# Patient Record
Sex: Female | Born: 1980
Health system: Southern US, Community
[De-identification: ages and names within clinical notes are randomized; demographics above are authoritative.]

## PROBLEM LIST (undated history)

## (undated) DIAGNOSIS — R0602 Shortness of breath: Secondary | ICD-10-CM

## (undated) DIAGNOSIS — G8929 Other chronic pain: Secondary | ICD-10-CM

## (undated) DIAGNOSIS — M51369 Other intervertebral disc degeneration, lumbar region without mention of lumbar back pain or lower extremity pain: Secondary | ICD-10-CM

## (undated) DIAGNOSIS — F329 Major depressive disorder, single episode, unspecified: Secondary | ICD-10-CM

## (undated) DIAGNOSIS — M5136 Other intervertebral disc degeneration, lumbar region: Secondary | ICD-10-CM

## (undated) DIAGNOSIS — R569 Unspecified convulsions: Secondary | ICD-10-CM

## (undated) DIAGNOSIS — M549 Dorsalgia, unspecified: Secondary | ICD-10-CM

## (undated) DIAGNOSIS — K219 Gastro-esophageal reflux disease without esophagitis: Secondary | ICD-10-CM

## (undated) DIAGNOSIS — F1921 Other psychoactive substance dependence, in remission: Secondary | ICD-10-CM

## (undated) DIAGNOSIS — M5126 Other intervertebral disc displacement, lumbar region: Secondary | ICD-10-CM

## (undated) DIAGNOSIS — F419 Anxiety disorder, unspecified: Secondary | ICD-10-CM

## (undated) DIAGNOSIS — R05 Cough: Secondary | ICD-10-CM

## (undated) DIAGNOSIS — R053 Chronic cough: Secondary | ICD-10-CM

## (undated) DIAGNOSIS — F32A Depression, unspecified: Secondary | ICD-10-CM

## (undated) HISTORY — PX: WISDOM TOOTH EXTRACTION: SHX21

## (undated) HISTORY — PX: TUBAL LIGATION: SHX77

## (undated) HISTORY — PX: CHOLECYSTECTOMY: SHX55

---
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
## 2002-03-01 ENCOUNTER — Inpatient Hospital Stay (HOSPITAL_COMMUNITY): Admission: EM | Admit: 2002-03-01 | Discharge: 2002-03-05 | Payer: Self-pay | Admitting: Psychiatry

## 2002-12-31 ENCOUNTER — Emergency Department (HOSPITAL_COMMUNITY): Admission: EM | Admit: 2002-12-31 | Discharge: 2002-12-31 | Payer: Self-pay | Admitting: Emergency Medicine

## 2003-10-18 ENCOUNTER — Emergency Department (HOSPITAL_COMMUNITY): Admission: EM | Admit: 2003-10-18 | Discharge: 2003-10-18 | Payer: Self-pay | Admitting: Emergency Medicine

## 2004-05-09 ENCOUNTER — Emergency Department (HOSPITAL_COMMUNITY): Admission: EM | Admit: 2004-05-09 | Discharge: 2004-05-09 | Payer: Self-pay | Admitting: *Deleted

## 2005-05-14 ENCOUNTER — Emergency Department (HOSPITAL_COMMUNITY): Admission: EM | Admit: 2005-05-14 | Discharge: 2005-05-15 | Payer: Self-pay | Admitting: Emergency Medicine

## 2005-08-09 ENCOUNTER — Observation Stay (HOSPITAL_COMMUNITY): Admission: AD | Admit: 2005-08-09 | Discharge: 2005-08-10 | Payer: Self-pay | Admitting: Obstetrics and Gynecology

## 2005-11-04 ENCOUNTER — Ambulatory Visit (HOSPITAL_COMMUNITY): Admission: AD | Admit: 2005-11-04 | Discharge: 2005-11-04 | Payer: Self-pay | Admitting: Obstetrics and Gynecology

## 2005-11-08 ENCOUNTER — Inpatient Hospital Stay (HOSPITAL_COMMUNITY): Admission: AD | Admit: 2005-11-08 | Discharge: 2005-11-12 | Payer: Self-pay | Admitting: Obstetrics and Gynecology

## 2005-11-09 ENCOUNTER — Encounter: Payer: Self-pay | Admitting: Obstetrics and Gynecology

## 2006-08-02 ENCOUNTER — Inpatient Hospital Stay (HOSPITAL_COMMUNITY): Admission: EM | Admit: 2006-08-02 | Discharge: 2006-08-08 | Payer: Self-pay | Admitting: Emergency Medicine

## 2006-08-02 ENCOUNTER — Ambulatory Visit: Payer: Self-pay | Admitting: Internal Medicine

## 2006-08-05 ENCOUNTER — Encounter (INDEPENDENT_AMBULATORY_CARE_PROVIDER_SITE_OTHER): Payer: Self-pay | Admitting: *Deleted

## 2007-05-28 ENCOUNTER — Emergency Department (HOSPITAL_COMMUNITY): Admission: EM | Admit: 2007-05-28 | Discharge: 2007-05-28 | Payer: Self-pay | Admitting: Emergency Medicine

## 2007-09-27 ENCOUNTER — Encounter: Payer: Self-pay | Admitting: Orthopedic Surgery

## 2007-09-27 ENCOUNTER — Emergency Department (HOSPITAL_COMMUNITY): Admission: EM | Admit: 2007-09-27 | Discharge: 2007-09-27 | Payer: Self-pay | Admitting: Emergency Medicine

## 2007-10-06 ENCOUNTER — Ambulatory Visit: Payer: Self-pay | Admitting: Orthopedic Surgery

## 2007-10-06 DIAGNOSIS — S62319A Displaced fracture of base of unspecified metacarpal bone, initial encounter for closed fracture: Secondary | ICD-10-CM | POA: Insufficient documentation

## 2007-11-20 ENCOUNTER — Ambulatory Visit: Payer: Self-pay | Admitting: Orthopedic Surgery

## 2009-06-05 ENCOUNTER — Emergency Department (HOSPITAL_COMMUNITY): Admission: EM | Admit: 2009-06-05 | Discharge: 2009-06-05 | Payer: Self-pay | Admitting: Emergency Medicine

## 2009-06-22 ENCOUNTER — Emergency Department (HOSPITAL_COMMUNITY): Admission: EM | Admit: 2009-06-22 | Discharge: 2009-06-22 | Payer: Self-pay | Admitting: Emergency Medicine

## 2009-06-30 ENCOUNTER — Emergency Department (HOSPITAL_COMMUNITY): Admission: EM | Admit: 2009-06-30 | Discharge: 2009-06-30 | Payer: Self-pay | Admitting: Internal Medicine

## 2009-09-25 ENCOUNTER — Emergency Department (HOSPITAL_COMMUNITY): Admission: EM | Admit: 2009-09-25 | Discharge: 2009-09-25 | Payer: Self-pay | Admitting: Emergency Medicine

## 2009-11-25 ENCOUNTER — Emergency Department (HOSPITAL_COMMUNITY): Admission: EM | Admit: 2009-11-25 | Discharge: 2009-11-25 | Payer: Self-pay | Admitting: Emergency Medicine

## 2010-02-19 ENCOUNTER — Emergency Department (HOSPITAL_COMMUNITY): Admission: EM | Admit: 2010-02-19 | Discharge: 2010-02-19 | Payer: Self-pay | Admitting: Emergency Medicine

## 2010-04-29 ENCOUNTER — Emergency Department (HOSPITAL_COMMUNITY): Admission: EM | Admit: 2010-04-29 | Discharge: 2010-04-29 | Payer: Self-pay | Admitting: Emergency Medicine

## 2010-06-20 ENCOUNTER — Other Ambulatory Visit: Admission: RE | Admit: 2010-06-20 | Discharge: 2010-06-20 | Payer: Self-pay | Admitting: Obstetrics & Gynecology

## 2010-08-04 ENCOUNTER — Inpatient Hospital Stay (HOSPITAL_COMMUNITY): Admission: RE | Admit: 2010-08-04 | Discharge: 2010-08-07 | Payer: Self-pay | Admitting: Obstetrics and Gynecology

## 2010-10-08 ENCOUNTER — Encounter: Payer: Self-pay | Admitting: General Surgery

## 2010-11-28 LAB — URINALYSIS, ROUTINE W REFLEX MICROSCOPIC
Bilirubin Urine: NEGATIVE
Glucose, UA: NEGATIVE mg/dL
Ketones, ur: NEGATIVE mg/dL
Protein, ur: NEGATIVE mg/dL
pH: 6 (ref 5.0–8.0)

## 2010-11-28 LAB — CBC
HCT: 22.8 % — ABNORMAL LOW (ref 36.0–46.0)
HCT: 31.8 % — ABNORMAL LOW (ref 36.0–46.0)
Hemoglobin: 11 g/dL — ABNORMAL LOW (ref 12.0–15.0)
Hemoglobin: 8 g/dL — ABNORMAL LOW (ref 12.0–15.0)
MCH: 31.4 pg (ref 26.0–34.0)
MCV: 88.5 fL (ref 78.0–100.0)
MCV: 89.5 fL (ref 78.0–100.0)
RBC: 2.55 MIL/uL — ABNORMAL LOW (ref 3.87–5.11)
RBC: 3.6 MIL/uL — ABNORMAL LOW (ref 3.87–5.11)
WBC: 5.9 10*3/uL (ref 4.0–10.5)

## 2010-12-01 LAB — URINALYSIS, ROUTINE W REFLEX MICROSCOPIC
Bilirubin Urine: NEGATIVE
Glucose, UA: NEGATIVE mg/dL
Hgb urine dipstick: NEGATIVE
Ketones, ur: 15 mg/dL — AB
pH: 6.5 (ref 5.0–8.0)

## 2010-12-01 LAB — URINE CULTURE
Colony Count: >=100000
Culture  Setup Time: 201108142123

## 2010-12-04 LAB — COMPREHENSIVE METABOLIC PANEL
AST: 35 U/L (ref 0–37)
Albumin: 3.6 g/dL (ref 3.5–5.2)
Calcium: 9.1 mg/dL (ref 8.4–10.5)
Chloride: 103 mEq/L (ref 96–112)
Creatinine, Ser: 0.62 mg/dL (ref 0.4–1.2)
GFR calc Af Amer: >60 mL/min (ref >60)

## 2010-12-04 LAB — URINALYSIS, ROUTINE W REFLEX MICROSCOPIC
Glucose, UA: NEGATIVE mg/dL
Hgb urine dipstick: NEGATIVE
Protein, ur: NEGATIVE mg/dL

## 2010-12-04 LAB — URINE MICROSCOPIC-ADD ON

## 2010-12-04 LAB — DIFFERENTIAL
Eosinophils Relative: 2 % (ref 0–5)
Lymphocytes Relative: 30 % (ref 12–46)
Lymphs Abs: 2.1 10*3/uL (ref 0.7–4.0)
Monocytes Absolute: 0.4 10*3/uL (ref 0.1–1.0)

## 2010-12-04 LAB — ABO/RH: ABO/RH(D): A POS

## 2010-12-04 LAB — WET PREP, GENITAL: Yeast Wet Prep HPF POC: NONE SEEN

## 2010-12-04 LAB — CBC
MCV: 86.8 fL (ref 78.0–100.0)
Platelets: 195 10*3/uL (ref 150–400)
WBC: 7.1 10*3/uL (ref 4.0–10.5)

## 2010-12-04 LAB — GC/CHLAMYDIA PROBE AMP, GENITAL: Chlamydia, DNA Probe: NEGATIVE

## 2010-12-04 LAB — POCT PREGNANCY, URINE: Preg Test, Ur: POSITIVE

## 2010-12-04 LAB — URINE CULTURE: Colony Count: 30000

## 2010-12-10 ENCOUNTER — Emergency Department (HOSPITAL_COMMUNITY)
Admission: EM | Admit: 2010-12-10 | Discharge: 2010-12-10 | Disposition: A | Discharge status: Home / Self Care | Payer: Medicaid Other | Attending: Emergency Medicine | Admitting: Emergency Medicine

## 2010-12-10 ENCOUNTER — Emergency Department (HOSPITAL_COMMUNITY): Payer: Medicaid Other

## 2010-12-10 DIAGNOSIS — M549 Dorsalgia, unspecified: Secondary | ICD-10-CM | POA: Insufficient documentation

## 2010-12-10 DIAGNOSIS — Y92009 Unspecified place in unspecified non-institutional (private) residence as the place of occurrence of the external cause: Secondary | ICD-10-CM | POA: Insufficient documentation

## 2010-12-10 DIAGNOSIS — S40019A Contusion of unspecified shoulder, initial encounter: Secondary | ICD-10-CM | POA: Insufficient documentation

## 2010-12-10 DIAGNOSIS — W108XXA Fall (on) (from) other stairs and steps, initial encounter: Secondary | ICD-10-CM | POA: Insufficient documentation

## 2010-12-11 ENCOUNTER — Emergency Department (HOSPITAL_COMMUNITY)
Admission: EM | Admit: 2010-12-11 | Discharge: 2010-12-12 | Disposition: A | Discharge status: Home / Self Care | Payer: Medicaid Other | Attending: Emergency Medicine | Admitting: Emergency Medicine

## 2010-12-11 DIAGNOSIS — T07XXXA Unspecified multiple injuries, initial encounter: Secondary | ICD-10-CM | POA: Insufficient documentation

## 2010-12-22 LAB — URINALYSIS, ROUTINE W REFLEX MICROSCOPIC
Bilirubin Urine: NEGATIVE
Glucose, UA: NEGATIVE mg/dL
Hgb urine dipstick: NEGATIVE
Specific Gravity, Urine: >1.03 — ABNORMAL HIGH (ref 1.005–1.030)
pH: 6 (ref 5.0–8.0)

## 2011-01-13 ENCOUNTER — Emergency Department (HOSPITAL_COMMUNITY)
Admission: EM | Admit: 2011-01-13 | Discharge: 2011-01-13 | Disposition: A | Discharge status: Home / Self Care | Payer: Medicaid Other | Attending: Emergency Medicine | Admitting: Emergency Medicine

## 2011-01-13 DIAGNOSIS — S335XXA Sprain of ligaments of lumbar spine, initial encounter: Secondary | ICD-10-CM | POA: Insufficient documentation

## 2011-01-13 DIAGNOSIS — Y92009 Unspecified place in unspecified non-institutional (private) residence as the place of occurrence of the external cause: Secondary | ICD-10-CM | POA: Insufficient documentation

## 2011-01-13 DIAGNOSIS — G43909 Migraine, unspecified, not intractable, without status migrainosus: Secondary | ICD-10-CM | POA: Insufficient documentation

## 2011-01-13 DIAGNOSIS — Y998 Other external cause status: Secondary | ICD-10-CM | POA: Insufficient documentation

## 2011-01-13 DIAGNOSIS — X500XXA Overexertion from strenuous movement or load, initial encounter: Secondary | ICD-10-CM | POA: Insufficient documentation

## 2011-01-13 DIAGNOSIS — F411 Generalized anxiety disorder: Secondary | ICD-10-CM | POA: Insufficient documentation

## 2011-02-02 NOTE — Consult Note (Signed)
Victoria Terrell, Victoria Terrell             ACCOUNT NO.:  [id number]   MEDICAL RECORD NO.:  [id number]          PATIENT TYPE:  INP   LOCATION:  A332                          FACILITY:  [facility]   PHYSICIAN:  Lionel December, M.D.    DATE OF BIRTH:  07/06/1981   DATE OF CONSULTATION:  08/02/2006  DATE OF DISCHARGE:                                   CONSULTATION   REASON FOR CONSULTATION:  Dilated biliary system in a patient who also has  acute cholecystitis.   HISTORY OF PRESENT ILLNESS:  Victoria Terrell is a 30 year old Caucasian female who  has had intermittent upper abdominal pain over the last several years and  has been treated for PUD and/or GERD with a PPI.  She was in her usual state  of health until two days ago when she developed pain in her right upper  quadrant and pain has been progressive and constant and intense.  She also  experienced nausea and vomiting yesterday.  On one occasion, she vomited  small amount of fresh blood.  She has not experienced any melena.  She also  has been having chills and felt hot at times and other times cold, although  she did not check her temperature.  During the last two days, she has  developed a distaste for cigarette smoking.  As she was not feeling any  better, she decided to come to the emergency room this morning.  She was  noted to have an elevated white cell count this morning. She was noted to  have leukocytosis.  She was very tender in the right upper quadrant.  She  had normal LFTs.  She had plain films followed by abdominal pelvic CT and  ultrasound.  She had a thickened gallbladder wall with pericholecystic fluid  collection as well as cholelithiasis, a large CBD measuring 15-16 cm, and  prominent intrahepatic biliary radicals.  CT also showed a small amount of  ascites.  She had an ultrasound which essentially showed similar findings as  far as hepatobiliary tree is concerned and there is a suggestion of a stone  in her bile duct.  The patient was  admitted to Dr. Daisy Blossom service.  She  has been begun on cephalosporin and he plans to remove her gallbladder  within the next couple of days in the near future.  Because of a dilated  bile duct and the question of stones, he felt the patient may need ERCP at  some point, hence, the reason for consultation.   The patient states she was diagnosed with peptic ulcer disease when she was  30 years old.  She has been on Prilosec for a number of years but now she is  on Nexium 40 mg b.i.d., but she does not take it every day.  Sometimes she  feels it is helping but other times is not.  The pain has been epigastric  and right upper quadrant.  She was having problems during her pregnancy,  however, she did not have ultrasound of for gallbladder.   At home, she is on Nexium 40 mg b.i.d. and Xanax 1 mg q.h.s.  PAST MEDICAL HISTORY:  History of PUD as above.  She had a C-section eight  months ago.   ALLERGIES:  No known drug allergies.   CURRENT MEDICATIONS:  She is presently on Rocephin 1 gram IV q.24h., Ativan  1 mg p.o. q.h.s., morphine sulfate 2-3 mg q.4h. IV p.r.n., Zofran 4 mg IV  q.8h. p.r.n.   FAMILY HISTORY:  Father had his gallbladder removed when he was 85 years  old.  Mother and one sister are in good health.   SOCIAL HISTORY:  She is single.  She is engaged to be married.  She has  worked at [employer] and on but is presently staying home caring for  her daughter.  She has been smoking cigarettes since she was 15, presently  smoking half a pack a day.  She does not drink alcohol.   PHYSICAL EXAMINATION:  GENERAL:  Well-developed, mildly obese Caucasian female who appears to be in  some pain and prefers to lie in a propped up position.  VITAL SIGNS:  Her estimated weight is 170 pounds (per patient, she has not  been weighed yet), she is 5 feet 6 inches tall, pulse 72 per minute, blood  pressure 108/60, temperature is 97.9, respirations 20.  HEENT:  Conjunctivae is  pink.  Sclerae is nonicteric.  Oral pharyngeal  mucosa is normal.  NECK:  No neck masses are noted.  CARDIAC:  Regular rate and rhythm.  Normal S1 and S2.  No murmur or gallop  noted.  LUNGS:  Clear to auscultation.  She does have two tattoos over her upper  chest posteriorly and one at the base of her neck.  ABDOMEN:  Symmetrical, bowel sounds are normal, soft with moderate  tenderness with guarding below the right costal margin.  Liver edge is not  felt.  EXTREMITIES:  She does not have clubbing or peripheral edema.   LABS ON ADMISSION:  WBC is 14.2, H&H is 12.7 and 38.2, platelet count is  281,000, she has 81 segs.  Her bilirubin is 4.7, AP 101, AST 15, ALT 12,  total protein 7.4, with albumin of 3.8, calcium is 9.  Sodium 135, potassium  3.5, chloride 102, CO2 26, glucose 117, BUN 10, creatinine 0.7.   Her CT and ultrasound was reviewed with Dr. Chestine Spore with findings as above.   ASSESSMENT:  Victoria Terrell is a 30 year old Caucasian female who presents with a  two day history of right upper quadrant pain associated with nausea,  vomiting, chills and possibly fever, with a single episode of hematemesis  plus leukocytosis and normal LFTs and serum lipase (19).  She has a  thickened gallbladder wall on CT and ultrasound with pericholecystic fluid  on both of these studies suggestive of acute cholecystitis.  She has  multiple stones.  She also has a dilated bile duct with a question of stone  distally but her bilirubin and transaminase and alkaline phosphatase are  normal, implying that she does not have bile duct obstruction.  Given that  her duct is over 15 mm, it is possible that she has been passing stones  intermittently and may been having biliary colic and not PUD as has been  suspected in the past.  Since her bilirubin and transaminases are normal,  there is no urgency to do ERCP.  However, if her LFTs are abnormal in the a.m., I would consider ERCP and sphincterotomy.  I suspect her  present  symptomatology is secondary to acute cholecystitis and she is on  a  cephalosporin and surgery is planned in the future by Dr. Malvin Johns.  She has  a small amount of ascites and this may be related to her acute  cholecystitis.  There is nothing to suggest perforated gallbladder.  Fluid  in her pelvis may not even be related to her biliary tract disease.  Her  pregnancy test was done and is negative.   She had a single episode of hematemesis most likely secondary to [facility]-  Weiss tear.  Her upper GI tract could be evaluated prior to ERCP.   The patient needs to be tested for H. pylori serology given this history of  PUD, although it has never been confirmed that she had PUD.   She has multiple tattoos and, therefore, at risk for hep C.   RECOMMENDATIONS:  1. LFTs in a.m. as planned.  2. I have taken the liberty of discontinuing her Pepcid and will start her      on Protonix 40 mg IV q.24h.  3. We will check her H. pylori serology and hepatitis C virus antibody.  4. Will consider a therapeutic ERCP in a.m. if LFTs are elevated,      otherwise, may delay it until after cholecystectomy.  Hopefully, she      can have interoperative cholangiogram at the time of cholecystectomy      and if she, indeed, has stones then she could have an ERCP following      her cholecystectomy.   We would like to thank Dr. Malvin Johns for the opportunity to participate in  the care of this nice lady.      Lionel December, M.D.  Electronically Signed     NR/MEDQ  D:  08/02/2006  T:  08/02/2006  Job:  [id number]

## 2011-02-02 NOTE — Op Note (Signed)
[name], Victoria Terrell             ACCOUNT NO.:  [id number]   MEDICAL RECORD NO.:  [id number]          PATIENT TYPE:  INP   LOCATION:  LDR1                          FACILITY:  [facility]   PHYSICIAN:  [name], M.D. DATE OF BIRTH:  1981/08/24   DATE OF PROCEDURE:  11/09/2005  DATE OF DISCHARGE:                                 OPERATIVE REPORT   PREOPERATIVE DIAGNOSIS:  Pregnancy, 40+ weeks gestation, uncertain fetal  status.   POSTOPERATIVE DIAGNOSIS:  Pregnancy, 40+ weeks gestation, uncertain fetal  status. Uncertain fetal status secondary to nuchal cord x1.   PROCEDURE:  Primary low transverse cervical Cesarean section.   SURGEON:  [name], M.D.   ASSISTANTTomasa Rand, R.N., [place].   ANESTHESIA:  Epidural.   COMPLICATIONS:  None.   FINDINGS:  Light meconium discolored amniotic fluid and placenta, molded  vertex and persistent occiput transverse position. Tight nuchal cord x1.  Scanty Wharton's generally on the cord.   DETAILS OF PROCEDURE:  The patient was taken to the operating room, prepped  and draped for lower abdominal surgery with scalp electrode in place. Due to  the fetal well-being being confirmed, once we got her downstairs, we  proceeded with lidocaine epidural and allowed it to set up. Fetal well-being  was monitored continuous. Once analgesic effect was achieved, a transverse  uterine incision was performed, excising some skin that included two sites  where they were pigmented nevi that needed to be removed for appearance  concerns. This was taken out and the skin crease beneath it. Lower abdomen  was opened in the standard method of Pfannenstiel incision and peritoneal  cavity found to be normal. Bladder flap was developed on the lower uterine  segment with ease and transverse uterine incision performed carefully down  to the vertex, extending laterally using index finger traction and the fetal  vertex rotated into the incision and delivered  using fundal pressure. Vacuum  extractor was placed on the table but was not utilized. The infant was  delivered. Nuchal cord x1 encountered, slipped over the vertex, and then the  head delivered. We suctioned the mouth and nasopharynx out prior to delivery  of the fetal body. Nuchal cord x1 had been released. Shoulders were used for  axillary traction. The head was not utilized. The body was delivered, cord  clamped, and the baby passed to the waiting pediatrician, Dr. Francoise Schaumann.  Halm.   Uterus was irrigated with antibiotic solution after the placenta had been  delivered. Cord blood samples were obtained prior to placental extraction.   Single layer of running locking closure of the uterus was performed.  Hemostasis found to be adequate. Two-0 chromic closure of the bladder flap  performed. The abdomen was irrigated. Laparotomy tapes removed. The  peritoneum closed with 2-0 chromic and then the fascia closed with 0 Vicryl  and subcu fatty tissues reapproximated with 2-0 plain. Staple closure of the  skin completed the procedure with 800 cc EBL. Sponge and needle counts  correct.      [name], M.D.  Electronically Signed     JVF/MEDQ  D:  11/09/2005  T:  11/09/2005  Job:  [id number]

## 2011-02-02 NOTE — H&P (Signed)
[facility]  Patient:    Victoria Terrell, Victoria Terrell Visit Number: [id number] MRN: [id number]          Service Type: PSY Location: 300 0301 02 Attending Physician:  Rachael Fee Dictated by:   Candi Leash. Orsini, N.P. Admit Date:  03/01/2002                     Psychiatric Admission Assessment  IDENTIFYING INFORMATION:  An 30 year old single white female, voluntarily admitted for intentional overdose on March 01, 2002.  HISTORY OF PRESENT ILLNESS:  The patient presents with a history of intentional overdose, having a "bad time" at night, having difficulty sleeping, reports she has been thinking about her life, that she has no license.  She has dropped out of school.  She is having relationship problems with her boyfriend.  The patient overdosed at home, taking 7 Tylenol tablets, 20 Prilosec tablets, and 2-3 amoxicillin tablets, and several of her birth control pills.  The patient was at home.  She then told her mother and was taken to the emergency department.  The patient states she knows that it was a stupid thing to do.  Her sleeping has been decreased.  Her appetite has been satisfactory.  The patient does report some depressive symptoms, has been on an antidepressant in the past.  She reports problems with hopelessness, but denies any psychotic symptoms.  PAST PSYCHIATRIC HISTORY:  First hospitalization at [facility]. Was hospitalized years ago as an adolescent.  No outpatient treatment.  SOCIAL HISTORY:  She is an 30 year old single white female with no children. She lives with her mother.  The last grade she completed was the 9th grade. She has no drivers license.  She states her parents are supportive.  No legal problems.  FAMILY HISTORY:  Father with social anxiety, sister with bipolar.  ALCOHOL DRUG HISTORY:  Patient smokes, she denies any alcohol use, smokes marijuana.  PAST MEDICAL HISTORY:  Primary care Jisella Ashenfelter is Dr. Stephannie Peters.   Medical problems are history of ulcers.  MEDICATIONS:  Prilosec 20 mg q.d., has been on Paxil in the past.  DRUG ALLERGIES:  No known allergies.  PHYSICAL EXAMINATION:  Performed at emergency room at [facility].  Her urine pregnancy test was negative.  Urine drug screen was positive for THC.  Alcohol level was less than 5.  CBC within normal limits.  Potassium level was 3.4. Acetaminophen level was 3.6 and decreased to 3.  MENTAL STATUS EXAMINATION:  She is alert, young, cooperative female, dressed in hospital wear, fair eye contact.  Speech is clear.  Mood is depressed, affect is sad.  Thought processes are coherent, goal directed.  No auditory or visual hallucinations or suicidal or homicidal ideations.  Cognitive function intact.  Memory is good, judgment is poor, insight is fair.  ADMISSION DIAGNOSES: Axis I:    Depressive disorder not otherwise specified. Axis II:   Deferred. Axis III:  History of ulcers. Axis IV:   Problems with primary support group, education, occupation, and            other psychosocial problems. Axis V:    Current 35, estimated this past year 77.  INITIAL PLAN OF CARE:  Voluntary admission to [facility] for intentional overdose.  Contract for safety, check every 15 minutes.  Will offer clear liquids while patient is complaining of nausea and having episodes of vomiting.  The patient to attend groups, Ambien for sleep.  To increase her coping skills,  to follow up with mental health, have family session prior to discharge.  Will initiate an antidepressant when the patient is feeling better.  TENTATIVE LENGTH OF STAY:  3-4 days. Dictated by:   Candi Leash. Orsini, N.P. Attending Physician:  Rachael Fee DD:  03/01/02 TD:  03/02/02 Job: [id number]

## 2011-02-02 NOTE — H&P (Signed)
NAMEJAZALYN, MONDOR             ACCOUNT NO.:  [id number]   MEDICAL RECORD NO.:  [id number]          PATIENT TYPE:  OIB   LOCATION:  LDR2                          FACILITY:  [facility]   PHYSICIAN:  [name], M.D. DATE OF BIRTH:  1981/07/13   DATE OF ADMISSION:  08/09/2005  DATE OF DISCHARGE:  LH                                HISTORY & PHYSICAL   ADMITTING DIAGNOSIS:  1.  Pregnancy at [redacted] weeks gestation.  2.  Dehydration.  3.  Ketosis.  4.  Bronchitis.  5.  Possible urinary tract infection.   HISTORY OF PRESENT ILLNESS:  This 30 year old female, gravida 1, para 0,  last menstrual period Jan 29, 2005, placing [facility] at November 05, 2005.  By  the gestational criteria, she is now 27 plus weeks gestation.  She presents  tonight with 2 days of coughing, inability to keep anything down, vomiting  due to the coughing, with an additional history of 4 days of urinary  frequency.  Urinalysis shows a specific gravity of 1.030, 3+ ketonuria,  presence of nitrates and esterase.  She is admitted for hydration,  antitussives, initiation of antibiotic therapy to cover the possibility of a  UTI.   She complained earlier of decreased fetal movement, but the baby has been  quite active on the external monitor with fetal heart rate in the normal  range.  There has been no bleeding, gush of fluid, or uterine activity  reported by the patient, or noted during the initial assessment.   PHYSICAL EXAMINATION:  GENERAL:  A healthy rather ill-feeling Caucasian  female, alert and oriented x3.  Weight in the 220 range.  HEENT:  Injected conjunctivae and red nasal mucosa.  CHEST:  Tubular breath sounds with occasional wheezes.  ABDOMEN:  Nontender.  Fetal heart rate in the normal range.  PELVIC EXAM:  Deferred.   Urine specific gravity mentioned earlier at 1.030.   PLAN:  Antibiotics, overnight observation, hydration, short hospital stay.      [name], M.D.  Electronically Signed     JVF/MEDQ  D:  08/09/2005  T:  08/10/2005  Job:  [id number]   cc:   [facility]

## 2011-02-02 NOTE — Op Note (Signed)
NAMESHAENA, [name]             ACCOUNT NO.:  [id number]   MEDICAL RECORD NO.:  [id number]          PATIENT TYPE:  INP   LOCATION:  A412                          FACILITY:  [facility]   PHYSICIAN:  [name], M.D.   DATE OF BIRTH:  18-Nov-1980   DATE OF PROCEDURE:  11/09/2005  DATE OF DISCHARGE:                                 OPERATIVE REPORT   EPIDURAL NOTE  [name] is 5 cm and requesting an epidural to be placed.   DESCRIPTION OF PROCEDURE:  She is placed in sitting position.  Betadine prep  is used.  Field drape is placed.  Lidocaine 1% is injected into the L3-L4  interspace.  A 17-gauge Touhy needle is used and a loss of resistance  technique employed; and the epidural space is found with one pass  Then 10  mL of 0.125% bupivacaine is given, as a test dose, without ill effects.  The  epidural catheter is fed 5 cm into the epidural space; and an additional 10  mL is given through the epidural catheter which is taped down.  The  continuous infusion is begun at 12 mL/h of 0.125% bupivacaine with 2 mcg/mL  of fentanyl. The patient tolerated the procedure well.  She is getting good  pain relief.  Fetal heart rate tracing is stable.      [name], M.D.  Electronically Signed     LHE/MEDQ  D:  11/09/2005  T:  11/09/2005  Job:  [id number]

## 2011-02-02 NOTE — Consult Note (Signed)
Victoria Terrell, HARNISH NO.:  [id number]   MEDICAL RECORD NO.:  [id number]          PATIENT TYPE:  EMS   LOCATION:  ED                            FACILITY:  [facility]   PHYSICIAN:  Barbaraann Barthel, M.D. DATE OF BIRTH:  04/12/1981   DATE OF CONSULTATION:  08/02/2006  DATE OF DISCHARGE:                                 CONSULTATION   NOTE:  Surgery was asked to see this 30 year old white female for right  upper quadrant pain and for gallbladder disease after CT scan revealed  acute inflammation about the gallbladder.   HISTORY OF PRESENT MEDICAL ILLNESS:  The patient states that she has had  recurrent right upper quadrant pain. This is the worst episode she has  ever had. However, she has had recurrent right upper quadrant pain on a  long-term basis.  She thought that this was related to peptic ulcer  disease.  However, she has never had an upper GI endoscopy, and the  Prilosec which she has taken has not helped this disease, and food  worsens the disease and does not make it better.  This would lead me to  suspect that she has had recurrent cholecystitis at various times that  she has had these complaints.  She states that 2 days ago, about 2 or 3  hours after lunch, she had right upper quadrant pain, multiple episodes  of nausea and vomiting, and she remained this way for approximately 24  hours.  When the pain did not abate, she came to the emergency room, and  she had a CT scan of the abdomen done and other lab workup.  Her liver  function studies and amylase were grossly within normal limits.  Her  white count was mildly elevated with a left shift.  Her lipase was not  elevated.   PHYSICAL EXAMINATION:  GENERAL:  She is a 30 year old white female,  uncomfortable but in no acute distress.  VITAL SIGNS:  She is 5 feet 6 inches tall, weighs approximately 169  pounds, temperature is 97, her blood pressure running from 116-96  systolic/69-48.  Her pulse rate has been  from 94-69, respirations from  16-24 in the emergency room.  Her decrease in blood pressure was  concomitant with the administration of morphine for pain.   HEENT:  Head is normocephalic.  Eyes:  Extraocular movements are intact.  Pupils are round and react to light and accommodation.  There is no  conjunctival pallor or icteric tincture.  Nose and oral mucosa are  moist.  NECK:  Supple and cylindrical, without jugular vein distension,  thyromegaly, or tracheal deviation.  There is no cervical adenopathy,  and there are no bruits auscultated.  The patient has tattoos on the  back of her neck.  CHEST:  Clear both to anterior and posterior auscultation.  HEART:  Regular rhythm.  BREASTS/AXILLAE:  without masses.  ABDOMEN:  The patient is very tender in the right upper quadrant.  No  mass palpated.  However, she has clear guarding in the right upper  quadrant.  Bowel sounds are present.  No femoral or inguinal  hernias are  appreciated.  RECTAL:  Guaiac-negative stool.  EXTREMITIES:  Pulses are full and symmetrical, without clubbing,  varicosities, or cyanosis.  She has tattoos on her extremities.   REVIEW OF SYSTEMS:  GI:  Recurrent episodes of right upper quadrant pain  radiating to her back that had been postprandial in nature, accompanied  with nausea and vomiting.  No history of weight loss. No history of  hepatitis.  No history of bright red rectal bleeding, black tarry stools  or unexplained weight loss.  The patient has had these symptoms referred  to in the past for peptic ulcer disease.  However, she has not had an  endoscopy to confirm any of this.  GU:  No history of nephrolithiasis or dysuria.  OB/GYN:  She is a  gravida 1, para 0, cesarean 1, abortus 0  female who has an 16-month-old  child,  and a history of a having a maternal grandmother who has had  carcinoma of the breast.  Urine HCG is negative, and the patient is  currently having her menstrual cycle.   CARDIORESPIRATORY:  Grossly  within normal limits.  The patient smokes half a pack of cigarettes per  day and does not drink any alcohol.  MUSCULOSKELETAL:  Grossly within  normal limits.  NEUROLOGIC:  Within normal limits.  The patient has a  history of migraines occasionally.  ENDOCRINE:  No history of diabetes  or thyroid disease.   MEDICATIONS:  The patient takes Prilosec.   She has no known allergies.   PAST SURGICAL HISTORY:  C-section x1 only.   LABORATORY DATA:  The patient had a white count of 14.2, with an H&H of  12.7 and 38.2, platelet count 281,000, 81% neutrophils noted.  Metabolic  7 is grossly within normal limits.  Urine pregnancy is negative.  Lipase  is not elevated at 19.  Liver function studies are grossly within normal  limits, as mentioned above.  Metabolic 7 likewise within normal limits.  CT scan revealed some fluid and thickening around the gallbladder by CT  scan.  No stones are noted.   IMPRESSION:  Acute cholecystitis, likely secondary to cholelithiasis,  although this did not show up on computed tomography scan.   PLAN:  We will admit. We will obtain a sonogram to further evaluate her  gallbladder and biliary tree, and place her on antibiotics, and restrict  her diet, and plan for surgery during this admission.   We discussed surgery in detail with the patient and her family,  discussing complications not limited to but including bleeding,  infection, damage to bile ducts, perforation of organs, and transitory  diarrhea, and the possibility that an open rather than a laparoscopic  cholecystectomy may be needed.  We discussed this in detail.  All  questions were answered, and informed consent was obtained.      Barbaraann Barthel, M.D.  Electronically Signed     WB/MEDQ  D:  08/02/2006  T:  08/02/2006  Job:  [id number]   cc:   Billee Cashing, MD

## 2011-02-02 NOTE — Discharge Summary (Signed)
[facility]  Patient:    Victoria Terrell, Victoria Terrell Visit Number: [id number] MRN: [id number]          Service Type: PSY Location: 300 0301 02 Attending Physician:  Jeanice Lim Dictated by:   Jeanice Lim, M.D. Admit Date:  03/01/2002 Discharge Date: 03/05/2002                             Discharge Summary  IDENTIFYING DATA:  This is an 30 year old single Caucasian female voluntarily admitted for intentional overdose.  The patient reported having a bad time, difficulty sleeping, had dropped out of school, was having relationship problems and overdosed on Tylenol, Prilosec and amoxicillin.  She then told her mother and was taken to the emergency room.  MEDICATIONS:  Prilosec.  Had been on Paxil in the past.  ALLERGIES:  No known drug allergies.  PHYSICAL EXAMINATION:  Essentially within normal limits.  Performed at [facility].  LABORATORY DATA:  Tylenol level was within normal limits.  Otherwise within normal limits.  MENTAL STATUS EXAMINATION:  Alert, young, cooperative female dressed in hospital gown.  Fair eye contact.  Speech clear.  Mood depressed.  Affect sad. Thought process goal directed.  Thought content negative for auditory or visual hallucinations.  No suicidal or homicidal ideation.  Cognitively intact.  Judgment and insight poor and fair.  ADMISSION DIAGNOSES: Axis I:    Depressive disorder not otherwise specified. Axis II:   None. Axis III:  History of ulcers. Axis IV:   Moderate (problems with primary support group, education,            occupation). Axis V:    35/75.  HOSPITAL COURSE:  The patient was admitted and ordered routine p.r.n.s. Restarted on Prilosec and Ambien q.h.s. p.r.n. and started on Lexapro targeting depressive symptoms.  Lexapro was optimized and family meeting was held, which went well.  The patient reported positive response to crisis intervention.  Reported improvement in mood and  hopefulness.  Felt that she had developed healthier coping skills and regretted the overdose.  CONDITION ON DISCHARGE:  Improved.  Mood was more euthymic.  Affect brighter. Thought processes goal directed.  Thought content negative for dangerous ideation or psychotic symptoms.  DISCHARGE MEDICATIONS: 1. Protonix 40 mg q.a.m. 2. Lexapro 10 mg q.a.m. 3. Ambien 10 mg q.h.s. p.r.n. insomnia.  FOLLOW-UP:  [facility] on Monday, March 09, 2002 at 8:30 a.m.  DISCHARGE DIAGNOSES: Axis I:    Depressive disorder not otherwise specified. Axis II:   None. Axis III:  History of ulcers. Axis IV:   Moderate (problems with primary support group, education,            occupation). Axis V:    Global Assessment of Functioning on discharge 55. Dictated by:   Jeanice Lim, M.D. Attending Physician:  Jeanice Lim DD:  04/08/02 TD:  04/09/02 Job: [id number]

## 2011-02-02 NOTE — Discharge Summary (Signed)
Victoria Terrell, Victoria Terrell             ACCOUNT NO.:  [id number]   MEDICAL RECORD NO.:  [id number]          PATIENT TYPE:  INP   LOCATION:  A412                          FACILITY:  [facility]   PHYSICIAN:  [name], M.D. DATE OF BIRTH:  May 27, 1981   DATE OF ADMISSION:  11/08/2005  DATE OF DISCHARGE:  02/26/2007LH                                 DISCHARGE SUMMARY   ADMITTING DIAGNOSES:  1.  Pregnancy 40 weeks 4 days.  2.  Medical induction of labor.   DISCHARGE DIAGNOSES:  1.  Pregnancy 40 weeks 5 days, delivered.  2.  Nonreassuring fetal status secondary to nuchal cord.   PROCEDURES:  1.  November 09, 2005, Foley bulb cervical ripening.  2.  November 09, 2005, Pitocin induction of labor.  3.  November 09, 2005, primary low transverse cervical cesarean section, [name].   DISCHARGE MEDICATIONS:  1.  Tylox one q.6h. p.r.n. pain.  2.  Motrin 800 mg one q.8h. x10 days for uterine cramps.  3.  Ortho Tri-Cyclen Lo prescription to be begun in 2 weeks.   HOSPITAL SUMMARY:  This 30 year old primiparous female was admitted at 40  weeks 4 days for induction of labor.  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   HOSPITAL COURSE:  She was admitted for induction.  Cervix was 2 cm, 25%, -2  with patient placed on Foley bulb cervical ripening overnight.  The  following day she was put on Pitocin.  Admission laboratory work confirmed a  preoperative anemia, hemoglobin 9.7, hematocrit 27.8.  She was making  progress on November 09, 2005 in response to Pitocin but developed  nonreassuring fetal status with persistent prolonged decelerations  necessitating C-section delivery.   Primary low transverse cervical cesarean section performed midday November 09, 2005.  Postoperative hemoglobin 8.7, hematocrit 25.1.  The patient is  bottle feeding.  She required 3 days postop care to assume full activity and  was discharged  November 12, 2005 for routine postop care including staple  removal in 4 days.      [name], M.D.  Electronically Signed     JVF/MEDQ  D:  11/12/2005  T:  11/12/2005  Job:  [id number]   cc:   [facility]   Francoise Schaumann. Milford Cage DO, FAAP  Fax: [phone]

## 2011-02-02 NOTE — Discharge Summary (Signed)
Victoria Terrell, Victoria Terrell             ACCOUNT NO.:  [id number]   MEDICAL RECORD NO.:  [id number]          PATIENT TYPE:  INP   LOCATION:  A332                          FACILITY:  [facility]   PHYSICIAN:  Barbaraann Barthel, M.D. DATE OF BIRTH:  1981-07-31   DATE OF ADMISSION:  08/02/2006  DATE OF DISCHARGE:  11/11/2007LH                               DISCHARGE SUMMARY   DIAGNOSIS:  Acute cholecystitis secondary to cholelithiasis with done  with dilated common bile duct.   SECONDARY DIAGNOSIS:  Anxiety.   PROCEDURE:  On August 05, 2006, laparoscopic cholecystectomy.   CONSULTATIONS:  To the gastroenterology service, Dr. Gerilyn Nestle and Dr.  Jena Gauss.   Note, this is a 30 year old white female who was admitted to the  emergency room with right upper quadrant pain and was thought to have  gallbladder disease as noted by CT scan.  She was admitted when this  showed acute inflammation about the gallbladder.  A sonogram also  confirmed this.  She was noted to have an enlarged common bile duct.  We  obtained a GI consultation to discuss an ERCP on this patient  preoperatively.  The GI service decided to await ERCP, as the patient  had normal liver function studies normal amylase and lipase  preoperatively, and therefore she underwent laparoscopic  cholecystectomy.  This was uneventful; however, she had a short cystic  duct, and I did not feel it was safe practically to perform an  intraoperative cholangiogram.  Otherwise, the surgery went very well.  The patient had a drain placed, and postoperatively the patient did very  well.  She did have a small bump in her liver function studies.  Bilirubin, however, completely remained normal, and there was no sign of  any of biliary drainage from her Jackson-Pratt drain.  The GI service  then discussed with the patient of postoperative ERCP.  This, the  patient adamantly refused, as she was clinically getting better and her  liver function studies had a  downward trend and some of which were  normal and she had no bump in her lipase and her bilirubin remained  normal and she had no drainage at all from her Jackson-Pratt drain.  After advancing her diet, etc., we then discharged this patient with  plans for her to follow-up with the GI service to schedule an ERCP,  hopefully as an outpatient.  At present, this patient does not want an  ERCP at all, and has refused the same.  Postoperatively, her course was  very benign.  The patient preoperatively had considerable right upper  quadrant pain, etc., and after surgery this diminished, and she was able  to tolerate p.o. well, and her abdomen remained softer.  The wounds were  clean without signs of infection or drainage.  She had no leg pain,  shortness of breath or chest pain, and she was then discharged on the  third postoperative day.   LABORATORY DATA:  Showed that she was admitted with a white count of  14.2, with an H&H of 12.7 and 38.2.  At time of discharge, her white  count was 7.4 with  an H&H 10.1 and 30.2 which has remained stable  preoperatively after hydration.  At time of discharge, she had a normal  differential in her white count.  Her electrolytes were grossly within  normal limits.  Her BUN was 2 with a creatinine of 0.6.  Her bilirubin  the time of discharge total was 0.4.  Her alkaline phosphatase was  mildly elevated, 155 down from 162.  Her SGOT was normal at 827.  Her  SGPT was slightly elevated 40, with a normal level being 35.  Her  amylase and lipase remained within normal limits as 50 and 21,  respectively.  Her CT scan results as mentioned above, sonogram results  also as mentioned above.  She had preoperatively distended gallbladder  with stones and thickening with some edema, etc.  These were all  confirmed intra-operatively.  The patient otherwise did very well.  We  are at this point that she refuses to have an ERCP to further elucidate  the common bile duct.   It is possible that she has some small gravel in  there, and this is all been explained to her in detail, including the  risks of not undergoing the procedure, and the GI service has made  appropriate contention plans, depending upon this patient's cooperation.  This patient was discharged then on the third postoperative day.   DISCHARGE INSTRUCTIONS:  As follows, she is discharged on a low fat, no  greasy foods, no cholesterol type of diet.  She is told to increase her  activity as tolerated.  She is permitted to shower.  She may walk up and  down the stairs.  She is told to do no heavy lifting, driving or sexual  activity at present.  She is told to clean her surgical wounds with  alcohol three times a day.  She is given a prescription for Percocet, 1  tablet every 4 hours as needed for pain, and resume her preoperative  orders.  No aspirin products.  Xanax is 1 mg at bedtime as needed.  She  is encouraged to follow up with Dr. Jena Gauss, and those instructions have  been made, as she is also told to go the emergency room, if there are  any acute changes.  Otherwise, she has a follow-up appointment with me  this Tuesday at 10 o'clock in the morning.      Barbaraann Barthel, M.D.  Electronically Signed     WB/MEDQ  D:  08/08/2006  T:  08/08/2006  Job:  [id number]   cc:   R. Roetta Sessions, M.D.  [address]  [place]  [place] [address]

## 2011-02-02 NOTE — Op Note (Signed)
Victoria, Terrell             ACCOUNT NO.:  [id number]   MEDICAL RECORD NO.:  [id number]          PATIENT TYPE:  INP   LOCATION:  A332                          FACILITY:  [facility]   PHYSICIAN:  Barbaraann Barthel, M.D. DATE OF BIRTH:  Jun 19, 1981   DATE OF PROCEDURE:  08/05/2006  DATE OF DISCHARGE:                               OPERATIVE REPORT   SURGEON:  Dr. Malvin Johns.   PREOPERATIVE DIAGNOSIS:  Acute cholecystitis, cholelithiasis.   POSTOPERATIVE DIAGNOSIS:  Acute cholecystitis, cholelithiasis.   PROCEDURE:  Laparoscopic cholecystectomy.   NOTE:  This is a 30 year old white female who was admitted through the  emergency room with right upper quadrant pain, nausea and vomiting.  She  was found to have an inflamed gallbladder on CT scan which was later  confirmed by sonogram.  She had been dilated common bile duct.  Her  liver function studies and amylase were within normal limits.  We  obtained a preoperative GI consult and the plan was, since she had  normal liver function studies, to obtain a cholangiogram if possible, if  not, if needed, to do an ERCP postop if needed.  That decision would be  their's.  She had had years of right upper quadrant pain.  However, she  was thought to have peptic ulcer disease.  However, she did not go to  any family physician regarding this, but came to the emergency room for  recurrent episodes of abdominal pain.  She was also 8 months post  postpartum.   We discussed the need for laparoscopic cholecystectomy in this patient  and the possibility of going a cholangiogram if feasible.  We discussed  complications not limited to but including bleeding, infection, damage  to bile ducts, perforation of organs and transitory diarrhea.  Informed  consent was obtained.   GROSS OPERATIVE FINDINGS:  The patient had an edematous gallbladder that  was thickened, multiple stones within the gallbladder, a short cystic  duct which did not allow easy  cannulation and I did not do a  cholangiogram on this patient.  Common bile duct which was enlarged.  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  The rest of the right  upper quadrant otherwise was within normal limits.   TECHNIQUE:  The patient was placed in supine position, after the  adequate administration of general anesthesia via endotracheal  intubation.  Her entire abdomen was prepped with Betadine solution and  draped in usual manner.  A Foley catheter was aseptically inserted.  A  periumbilical incision was carried out over the superior aspect of the  umbilicus.  Skin, subcutaneous tissue was incised down to the fascia  which was grasped with a sharp towel clip and elevated.  The Veress  needle was inserted and confirmed position with saline drop test.  We  then insufflated the abdomen with approximately 3.5 liters of CO2, and  then placed an 11-mm cannula, using the Visiport technique, through the  umbilicus incision and then, under direct vision, three other cannulas  were placed, an 11-mm cannula in the epigastrium and two 5-mm cannulas  in right upper quadrant laterally.  The gallbladder was grasped.  We did  not have to decompress this.  She had subsiding edema.  We were able to  identify the cystic duct.  It was short, but we clearly were able to  identify this.  We triply silver clipped it on the side of the common  bile duct and singly silver clipped it on the side of the gallbladder.  Likewise, we triply silver clipped the cystic artery.  The gallbladder  was then removed, using the hook cautery device, from the liver bed.  There was a minimal amount of oozing.  The gallbladder was then grasped  and placed in the EndoCatch device.  There were small stones which were  likewise sucked up and some them removed as well through the Endo sac  device.  We then checked for hemostasis.  This was deemed complete.  I  irrigated copiously with normal saline solution and then  placed a  Surgicel within the liver bed, as well as a Jackson-Pratt drain which  exited through one of the lateral 5-mm cannula sites.  We then  desufflated the abdomen and then closed the port sites with 0 Polysorb,  using 0.5% Sensorcaine in these areas to help with postoperative  comfort.  Approximately 20 mL of this was used for that.  We then closed  the skin incisions with a stapling device and sutured the drain in place  with 3-0 nylon.  Prior to closure, all sponge, needle, instrument counts  were found to be correct.  Estimated blood loss was minimal.  The  patient received approximately 1600 mL of crystalloids intraoperatively.  There were no complications.      Barbaraann Barthel, M.D.  Electronically Signed     WB/MEDQ  D:  08/05/2006  T:  08/05/2006  Job:  [id number]   cc:   Lionel December, M.D.  [address]  [place]  [place] [address]

## 2011-02-02 NOTE — H&P (Signed)
NAMEJOBETH, PANGILINAN             ACCOUNT NO.:  [id number]   MEDICAL RECORD NO.:  [id number]          PATIENT TYPE:  INP   LOCATION:  LDR1                          FACILITY:  [facility]   PHYSICIAN:  [name], M.D.   DATE OF BIRTH:  July 22, 1981   DATE OF ADMISSION:  11/08/2005  DATE OF DISCHARGE:  LH                                HISTORY & PHYSICAL   HISTORY OF PRESENT ILLNESS:  Coni is a 30 year old white female gravida 1,  para 0, estimated date of delivery of November 05, 2005, currently at 40-4/[redacted]  weeks gestation who is admitted for induction of labor for post dates.  She  was brought in last night with cervix of 2 cm, underwent a Foley bulb  placement and is undergoing Pitocin induction this morning.  Her prenatal  course has been entirely unremarkable except for an early on low lying  placenta with a previa which resolved by 18 weeks. 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  She has never had an  outbreak that she is aware of.   PAST MEDICAL HISTORY:  Negative.   PAST SURGICAL HISTORY:  Negative.   OBSTETRICAL HISTORY:  She is nulliparous.   ALLERGIES:  None.   MEDICATIONS:  Prenatal vitamins.   LABORATORY DATA:  Blood type A positive, antibody screen was positive for  benzodiazepine's and opiates at her first visit.  Rubella is immune.  Hepatitis B was negative.  HIV was nonreactive. Serology nonreactive X2.  Pap smear was normal.  Gonorrhea and Chlamydia negative X2.  AFP was normal.  Group B Strep was negative.  Glucola was 104.  HSV 2 was positive. HSV 1 was  also positive.   PHYSICAL EXAMINATION:  HEENT: Unremarkable.  NECK:  Thyroid is normal.  LUNGS:  Clear.  HEART:  Regular rate and rhythm without murmurs, regurgitation or gallop.  ABDOMEN:  Last fundal height in the office was 37 cm.  PELVIC:  Her cervix was again 2, 25 and minus 2 vertex.  Estimated fetal  weight is 8 pounds clinically.  EXTREMITIES:  Warm with no  edema.  NEUROLOGICAL:  Grossly intact.   IMPRESSION:  Intrauterine pregnancy at 40-4/[redacted] weeks gestation.   PLAN:  Patient is admitted for induction of labor.      [name], M.D.  Electronically Signed     LHE/MEDQ  D:  11/09/2005  T:  11/09/2005  Job:  [id number]

## 2011-03-14 ENCOUNTER — Emergency Department (HOSPITAL_COMMUNITY)
Admission: EM | Admit: 2011-03-14 | Discharge: 2011-03-14 | Disposition: A | Discharge status: Home / Self Care | Payer: Medicaid Other | Attending: Emergency Medicine | Admitting: Emergency Medicine

## 2011-03-14 DIAGNOSIS — S335XXA Sprain of ligaments of lumbar spine, initial encounter: Secondary | ICD-10-CM | POA: Insufficient documentation

## 2011-03-14 DIAGNOSIS — F17200 Nicotine dependence, unspecified, uncomplicated: Secondary | ICD-10-CM | POA: Insufficient documentation

## 2011-03-14 DIAGNOSIS — X58XXXA Exposure to other specified factors, initial encounter: Secondary | ICD-10-CM | POA: Insufficient documentation

## 2011-03-14 DIAGNOSIS — F411 Generalized anxiety disorder: Secondary | ICD-10-CM | POA: Insufficient documentation

## 2011-03-14 DIAGNOSIS — F410 Panic disorder [episodic paroxysmal anxiety] without agoraphobia: Secondary | ICD-10-CM | POA: Insufficient documentation

## 2011-04-02 ENCOUNTER — Other Ambulatory Visit: Payer: Self-pay | Admitting: Physical Medicine and Rehabilitation

## 2011-04-02 DIAGNOSIS — M545 Low back pain: Secondary | ICD-10-CM

## 2011-04-09 ENCOUNTER — Other Ambulatory Visit: Payer: Medicaid Other

## 2011-04-17 ENCOUNTER — Emergency Department (HOSPITAL_COMMUNITY)
Admission: EM | Admit: 2011-04-17 | Discharge: 2011-04-17 | Disposition: A | Discharge status: Home / Self Care | Payer: Medicaid Other | Attending: Emergency Medicine | Admitting: Emergency Medicine

## 2011-04-17 ENCOUNTER — Encounter: Payer: Self-pay | Admitting: Emergency Medicine

## 2011-04-17 ENCOUNTER — Emergency Department (HOSPITAL_COMMUNITY): Payer: Medicaid Other

## 2011-04-17 DIAGNOSIS — S335XXA Sprain of ligaments of lumbar spine, initial encounter: Secondary | ICD-10-CM | POA: Insufficient documentation

## 2011-04-17 DIAGNOSIS — W010XXA Fall on same level from slipping, tripping and stumbling without subsequent striking against object, initial encounter: Secondary | ICD-10-CM | POA: Insufficient documentation

## 2011-04-17 DIAGNOSIS — M549 Dorsalgia, unspecified: Secondary | ICD-10-CM | POA: Insufficient documentation

## 2011-04-17 DIAGNOSIS — M25579 Pain in unspecified ankle and joints of unspecified foot: Secondary | ICD-10-CM | POA: Insufficient documentation

## 2011-04-17 DIAGNOSIS — F17200 Nicotine dependence, unspecified, uncomplicated: Secondary | ICD-10-CM | POA: Insufficient documentation

## 2011-04-17 DIAGNOSIS — S93409A Sprain of unspecified ligament of unspecified ankle, initial encounter: Secondary | ICD-10-CM | POA: Insufficient documentation

## 2011-04-17 MED ORDER — HYDROCODONE-ACETAMINOPHEN 5-325 MG PO TABS: 1.0000 | ORAL_TABLET | ORAL | Status: AC | PRN

## 2011-04-17 MED ORDER — IBUPROFEN 800 MG PO TABS: 800.0000 mg | ORAL_TABLET | Freq: Once | ORAL | Status: AC

## 2011-04-17 MED ORDER — IBUPROFEN 800 MG PO TABS
800.0000 mg | ORAL_TABLET | Freq: Once | ORAL | Status: AC
  Administered 2011-04-17: 800 mg via ORAL

## 2011-04-17 MED ORDER — HYDROCODONE-ACETAMINOPHEN 5-325 MG PO TABS
1.0000 | ORAL_TABLET | Freq: Once | ORAL | Status: AC
  Administered 2011-04-17: 1 via ORAL

## 2011-04-17 NOTE — ED Provider Notes (Addendum)
History     Chief Complaint  Patient presents with  . Fall  . Ankle Pain  . Back Pain   Patient is a 30 y.o. female presenting with fall. The history is provided by the patient.  Fall The accident occurred yesterday. The fall occurred while walking (She slipped off her high heeled shoes,  twisting her left ankle and causing pain to her lower back.). She landed on grass. Point of impact: Left side. Pain location: left ankle and lower back. The pain is at a severity of 10/10. The pain is severe. She was ambulatory at the scene. There was no drug use involved in the accident. Pertinent negatives include no numbness. The symptoms are aggravated by activity and standing (movement). She has tried NSAIDs for the symptoms. The treatment provided no relief.    History reviewed. No pertinent past medical history.  Past Surgical History  Procedure Date  . Cesarean section   . Cholecystectomy     History reviewed. No pertinent family history.  History  Substance Use Topics  . Smoking status: Current Everyday Smoker -- 1.0 packs/day    Types: Cigarettes  . Smokeless tobacco: Not on file  . Alcohol Use: No    OB History    Grav Para Term Preterm Abortions TAB SAB Ect Mult Living                  Review of Systems  Neurological: Negative for numbness.  All other systems reviewed and are negative.    Physical Exam  BP 119/60  Pulse 105  Temp(Src) 98.5 F (36.9 C) (Oral)  Resp 20  Ht 5\' 7"  (1.702 m)  Wt 201 lb (91.173 kg)  BMI 31.48 kg/m2  SpO2 100%  LMP 04/09/2011  Physical Exam  Vitals reviewed. Constitutional: She is oriented to person, place, and time. She appears well-developed and well-nourished.  HENT:  Head: Normocephalic and atraumatic.  Eyes: Conjunctivae are normal.  Neck: Normal range of motion.  Cardiovascular: Normal rate, regular rhythm, normal heart sounds and intact distal pulses.   Pulmonary/Chest: Effort normal and breath sounds normal. She has no  wheezes.  Abdominal: Soft. Bowel sounds are normal. There is no tenderness.  Musculoskeletal: She exhibits tenderness. She exhibits no edema.       Left ankle: She exhibits decreased range of motion. She exhibits no swelling, no ecchymosis, no deformity and normal pulse. tenderness. Lateral malleolus tenderness found. No proximal fibula tenderness found. Achilles tendon normal.       Lumbar back: She exhibits decreased range of motion, tenderness and pain. She exhibits no swelling, no edema and no deformity.  Neurological: She is alert and oriented to person, place, and time.  Skin: Skin is warm and dry.  Psychiatric: She has a normal mood and affect.    ED Course  Procedures  MDM  [place],  Crutches applied by RN.  Ice pack applied and ibuprofen 800 mg,  vicodin 1 tab given.  Medical screening examination/treatment/procedure(s) were performed by non-physician practitioner and as supervising physician I was immediately available for consultation/collaboration. Osvaldo Human, M.D.    Candis Musa, PA 04/17/11 1608  Candis Musa, PA 04/17/11 1609  Carleene Cooper III, MD 04/18/11 1036  Carleene Cooper III, MD 05/25/11 [phone]

## 2011-04-17 NOTE — ED Notes (Signed)
Pt c/o left ankle pain and lower back pain since falling and twisting her left ankle last night.

## 2011-04-17 NOTE — ED Notes (Signed)
EDPA at bedside to examine 

## 2011-04-17 NOTE — ED Notes (Signed)
ASO applied to left ankle; crutches given, adjusted for appropriate fit; pt demonstrates appropriate crutch ambulation

## 2011-04-17 NOTE — ED Notes (Signed)
C/o left ankle pain and lower back pain onset last night after fall; states was walking in high heeled shoes, twisted left ankle, causing her to fall.  C/o left lateral ankle pain and pain to lower back that radiates down LLE. Reports pain with weight bearing

## 2011-05-03 LAB — RPR: RPR: NONREACTIVE

## 2011-05-03 LAB — HIV ANTIBODY (ROUTINE TESTING W REFLEX): HIV: REACTIVE

## 2011-06-25 ENCOUNTER — Emergency Department (HOSPITAL_COMMUNITY): Payer: Medicaid Other

## 2011-06-25 ENCOUNTER — Encounter (HOSPITAL_COMMUNITY): Payer: Self-pay | Admitting: Emergency Medicine

## 2011-06-25 ENCOUNTER — Emergency Department (HOSPITAL_COMMUNITY)
Admission: EM | Admit: 2011-06-25 | Discharge: 2011-06-25 | Disposition: A | Discharge status: Home / Self Care | Payer: Medicaid Other | Attending: Emergency Medicine | Admitting: Emergency Medicine

## 2011-06-25 DIAGNOSIS — W108XXA Fall (on) (from) other stairs and steps, initial encounter: Secondary | ICD-10-CM | POA: Insufficient documentation

## 2011-06-25 DIAGNOSIS — W19XXXA Unspecified fall, initial encounter: Secondary | ICD-10-CM

## 2011-06-25 DIAGNOSIS — F17200 Nicotine dependence, unspecified, uncomplicated: Secondary | ICD-10-CM | POA: Insufficient documentation

## 2011-06-25 DIAGNOSIS — M545 Low back pain, unspecified: Secondary | ICD-10-CM | POA: Insufficient documentation

## 2011-06-25 DIAGNOSIS — M549 Dorsalgia, unspecified: Secondary | ICD-10-CM

## 2011-06-25 MED ORDER — HYDROMORPHONE HCL 1 MG/ML IJ SOLN
1.0000 mg | Freq: Once | INTRAMUSCULAR | Status: AC
  Administered 2011-06-25: 1 mg via INTRAVENOUS
  Filled 2011-06-25: qty 1

## 2011-06-25 MED ORDER — MORPHINE SULFATE 4 MG/ML IJ SOLN
4.0000 mg | Freq: Once | INTRAMUSCULAR | Status: AC
  Administered 2011-06-25: 4 mg via INTRAVENOUS
  Filled 2011-06-25: qty 1

## 2011-06-25 MED ORDER — OXYCODONE-ACETAMINOPHEN 5-325 MG PO TABS: 2.0000 | ORAL_TABLET | ORAL | Status: AC | PRN

## 2011-06-25 MED ORDER — MORPHINE SULFATE 4 MG/ML IJ SOLN
6.0000 mg | Freq: Once | INTRAMUSCULAR | Status: AC
  Administered 2011-06-25: 6 mg via INTRAVENOUS
  Filled 2011-06-25 (×2): qty 1

## 2011-06-25 NOTE — ED Notes (Signed)
Pt up to restroom, was unable to stand or walk w/out assistance.

## 2011-06-25 NOTE — ED Provider Notes (Signed)
History   Chart scribed for Doug Sou, MD by Enos Fling; the patient was seen in room APA01/APA01; this patient's care was started at 10:40 AM.    CSN: [id number] Arrival date & time: 06/25/2011 10:35 AM  Chief Complaint  Patient presents with  . Fall    HPI Victoria Terrell is a 30 y.o. female who presents to the Emergency Department BIB EMS on LSB/c-collar/CID complaining of back pain s/p fall. Pt states she slipped and fell down steps at home just pta. She c/o low back pain that has been constant since the fall. Pain is non-radiating. No numbness, tingling, weakness, or incontinence.  No complaints prior to fall. Pt otherwise healthy.  History reviewed. No pertinent past medical history.  Past Surgical History  Procedure Date  . Cesarean section   . Cholecystectomy     No family history on file.  History  Substance Use Topics  . Smoking status: Current Everyday Smoker -- 1.0 packs/day    Types: Cigarettes  . Smokeless tobacco: Not on file  . Alcohol Use: No    OB History    Grav Para Term Preterm Abortions TAB SAB Ect Mult Living                  Review of Systems  Constitutional: Negative.   HENT: Negative.   Respiratory: Negative.   Cardiovascular: Negative.   Gastrointestinal: Negative.   Musculoskeletal: Positive for back pain. Negative for gait problem.  Skin: Negative.   Neurological: Negative.   Hematological: Negative.   Psychiatric/Behavioral: Negative.     Allergies  Flexeril; Other; and Skelaxin  Home Medications   Current Outpatient Rx  Name Route Sig Dispense Refill  . ESCITALOPRAM OXALATE 10 MG PO TABS Oral Take 10 mg by mouth daily.      . IBUPROFEN 200 MG PO TABS Oral Take 200 mg by mouth every 8 (eight) hours as needed. pain      BP 91/60  Pulse 81  Temp(Src) 97.7 F (36.5 C) (Oral)  Resp 18  Ht 5\' 7"  (1.702 m)  Wt 200 lb (90.719 kg)  BMI 31.32 kg/m2  SpO2 97%  LMP 06/07/2011  Physical Exam  Constitutional: She is  oriented to person, place, and time. She appears well-developed and well-nourished.       Tearful, uncomfortable appearing  HENT:  Head: Normocephalic and atraumatic.  Eyes: Conjunctivae are normal. Pupils are equal, round, and reactive to light.  Neck: Neck supple. No tracheal deviation present. No thyromegaly present.  Cardiovascular: Normal rate and regular rhythm.   No murmur heard. Pulmonary/Chest: Effort normal and breath sounds normal.  Abdominal: Soft. Bowel sounds are normal. She exhibits no distension. There is no tenderness.  Musculoskeletal: Normal range of motion. She exhibits no edema.       Tenderness to lumbar spine; cervical and thoracic spine nontender; pelvis stable; moves all extremities  Neurological: She is alert and oriented to person, place, and time. She has normal strength. No cranial nerve deficit or sensory deficit. Coordination normal.  Skin: Skin is warm and dry. No rash noted.  Psychiatric: She has a normal mood and affect.    ED Course  Procedures - none  OTHER DATA REVIEWED: Nursing notes and vital signs reviewed.   Dg Lumbar Spine Complete  06/25/2011  *RADIOLOGY REPORT*  Clinical Data: Pain post fall  LUMBAR SPINE - COMPLETE 4+ VIEW  Comparison: 04/17/2011  Findings: Hypoplastic last pair of ribs. Five non-rib bearing lumbar vertebrae. Disc space narrowing  with minimal end plate spur formation L5-S1. Slight disc space narrowing L4-L5. Vertebral body and disc space heights otherwise maintained. No acute fracture, subluxation or bone destruction. No spondylolysis. SI joints symmetric. Surgical clips right upper quadrant question cholecystectomy.  IMPRESSION: Minimal degenerative disc disease changes lower lumbar spine. No acute abnormalities.  Original Report Authenticated By: Lollie Marrow, M.D.    1:48 PM All results reviewed and discussed with pt, questions answered, pt agreeable with plan. Pain improved.  MDM  Cervical spine clinically cleared by  me according to NEXUS criteria at 10:45am. At 2 PM patient is alert ambulatory is improved after pain medicine administered in the emergency department. Feels ready to go home  IMPRESSION: 1. Fall   2. Back pain      SCRIBE ATTESTATION: I personally performed the services described in this documentation, which was scribed in my presence. The recorded information has been reviewed and considered.      Doug Sou, MD 06/25/11 [phone]

## 2011-06-25 NOTE — ED Notes (Signed)
In with EDP. Pt assisted to sitting position. Complain of pain in lower back. Assisted to standing position . Still complain of pain in lower back

## 2011-09-03 ENCOUNTER — Encounter (HOSPITAL_COMMUNITY): Payer: Self-pay

## 2011-09-03 ENCOUNTER — Emergency Department (HOSPITAL_COMMUNITY)
Admission: EM | Admit: 2011-09-03 | Discharge: 2011-09-03 | Disposition: A | Discharge status: Home / Self Care | Payer: Medicaid Other | Attending: Emergency Medicine | Admitting: Emergency Medicine

## 2011-09-03 ENCOUNTER — Emergency Department (HOSPITAL_COMMUNITY): Payer: Medicaid Other

## 2011-09-03 DIAGNOSIS — Z331 Pregnant state, incidental: Secondary | ICD-10-CM | POA: Insufficient documentation

## 2011-09-03 DIAGNOSIS — M545 Low back pain, unspecified: Secondary | ICD-10-CM | POA: Insufficient documentation

## 2011-09-03 DIAGNOSIS — M62838 Other muscle spasm: Secondary | ICD-10-CM | POA: Insufficient documentation

## 2011-09-03 DIAGNOSIS — S39012A Strain of muscle, fascia and tendon of lower back, initial encounter: Secondary | ICD-10-CM

## 2011-09-03 DIAGNOSIS — W19XXXA Unspecified fall, initial encounter: Secondary | ICD-10-CM | POA: Insufficient documentation

## 2011-09-03 DIAGNOSIS — S339XXA Sprain of unspecified parts of lumbar spine and pelvis, initial encounter: Secondary | ICD-10-CM | POA: Insufficient documentation

## 2011-09-03 DIAGNOSIS — F17200 Nicotine dependence, unspecified, uncomplicated: Secondary | ICD-10-CM | POA: Insufficient documentation

## 2011-09-03 LAB — URINALYSIS, ROUTINE W REFLEX MICROSCOPIC
Bilirubin Urine: NEGATIVE
Ketones, ur: NEGATIVE mg/dL
Leukocytes, UA: NEGATIVE
Nitrite: NEGATIVE
Specific Gravity, Urine: >1.03 — ABNORMAL HIGH (ref 1.005–1.030)
Urobilinogen, UA: 0.2 mg/dL (ref 0.0–1.0)
pH: 5.5 (ref 5.0–8.0)

## 2011-09-03 LAB — URINE MICROSCOPIC-ADD ON

## 2011-09-03 LAB — POCT PREGNANCY, URINE: Preg Test, Ur: POSITIVE

## 2011-09-03 MED ORDER — HYDROCODONE-ACETAMINOPHEN 5-500 MG PO TABS: 1.0000 | ORAL_TABLET | Freq: Four times a day (QID) | ORAL | Status: AC | PRN

## 2011-09-03 MED ORDER — OXYCODONE-ACETAMINOPHEN 5-325 MG PO TABS
2.0000 | ORAL_TABLET | Freq: Once | ORAL | Status: AC
  Administered 2011-09-03: 2 via ORAL
  Filled 2011-09-03: qty 2

## 2011-09-03 MED ORDER — NAPROXEN 500 MG PO TABS: 500.0000 mg | ORAL_TABLET | Freq: Two times a day (BID) | ORAL | Status: DC

## 2011-09-03 MED ORDER — HYDROMORPHONE HCL PF 1 MG/ML IJ SOLN
1.0000 mg | Freq: Once | INTRAMUSCULAR | Status: AC
  Administered 2011-09-03: 1 mg via INTRAMUSCULAR
  Filled 2011-09-03: qty 1

## 2011-09-03 MED ORDER — IBUPROFEN 800 MG PO TABS
800.0000 mg | ORAL_TABLET | Freq: Once | ORAL | Status: AC
  Administered 2011-09-03: 800 mg via ORAL
  Filled 2011-09-03: qty 1

## 2011-09-03 MED ORDER — DIAZEPAM 5 MG PO TABS
5.0000 mg | ORAL_TABLET | Freq: Once | ORAL | Status: AC
  Administered 2011-09-03: 5 mg via ORAL
  Filled 2011-09-03: qty 1

## 2011-09-03 MED ORDER — PRENATAL COMPLETE 14-0.4 MG PO TABS: 1.0000 | ORAL_TABLET | Freq: Every day | ORAL | Status: DC

## 2011-09-03 NOTE — ED Provider Notes (Addendum)
History     CSN: [id number] Arrival date & time: 09/03/2011  5:18 PM   First MD Initiated Contact with Patient 09/03/11 1720      Chief Complaint  Patient presents with  . Back Pain    (Consider location/radiation/quality/duration/timing/severity/associated sxs/prior treatment) Patient is a 30 y.o. female presenting with back pain. The history is provided by the patient. No language interpreter was used.  Back Pain  This is a new problem. The current episode started 1 to 2 hours ago. The problem occurs constantly. The problem has been gradually worsening. The pain is associated with falling. The pain is present in the lumbar spine. The quality of the pain is described as aching and stabbing. The pain does not radiate. The pain is severe. The symptoms are aggravated by bending and certain positions. Pertinent negatives include no chest pain, no fever, no numbness, no headaches, no abdominal pain, no bowel incontinence, no bladder incontinence, no dysuria, no pelvic pain, no leg pain, no paresthesias and no weakness. She has tried nothing for the symptoms. Risk factors include obesity and lack of exercise.    History reviewed. No pertinent past medical history.  Past Surgical History  Procedure Date  . Cesarean section   . Cholecystectomy     No family history on file.  History  Substance Use Topics  . Smoking status: Current Everyday Smoker -- 1.0 packs/day    Types: Cigarettes  . Smokeless tobacco: Not on file  . Alcohol Use: No    OB History    Grav Para Term Preterm Abortions TAB SAB Ect Mult Living                  Review of Systems  Constitutional: Negative for fever, activity change, appetite change and fatigue.  HENT: Negative for congestion, sore throat, rhinorrhea, neck pain and neck stiffness.   Respiratory: Negative for cough and shortness of breath.   Cardiovascular: Negative for chest pain and palpitations.  Gastrointestinal: Negative for nausea, vomiting,  abdominal pain and bowel incontinence.  Genitourinary: Negative for bladder incontinence, dysuria, urgency, frequency, flank pain and pelvic pain.  Musculoskeletal: Positive for back pain. Negative for myalgias and arthralgias.  Neurological: Negative for dizziness, weakness, light-headedness, numbness, headaches and paresthesias.  All other systems reviewed and are negative.    Allergies  Flexeril; Other; and Skelaxin  Home Medications   Current Outpatient Rx  Name Route Sig Dispense Refill  . ALPRAZOLAM 1 MG PO TABS Oral Take 1 mg by mouth 2 (two) times daily.      . IBUPROFEN 200 MG PO TABS Oral Take 400 mg by mouth every 8 (eight) hours as needed. pain    . PRESCRIPTION MEDICATION Oral Take 1 tablet by mouth daily. Oral birth control medication. Unknown name. Gets sample packs from doctor.     Marland Kitchen HYDROCODONE-ACETAMINOPHEN 5-500 MG PO TABS Oral Take 1-2 tablets by mouth every 6 (six) hours as needed for pain. 7 tablet 0  . NAPROXEN 500 MG PO TABS Oral Take 1 tablet (500 mg total) by mouth 2 (two) times daily. 30 tablet 0    BP 106/63  Pulse 83  Temp(Src) 98.5 F (36.9 C) (Oral)  Resp 20  Ht 5\' 7"  (1.702 m)  Wt 208 lb (94.348 kg)  BMI 32.58 kg/m2  SpO2 99%  LMP 08/30/2011  Physical Exam  Nursing note and vitals reviewed. Constitutional: She is oriented to person, place, and time. She appears well-developed and well-nourished. She appears distressed (pain related).  HENT:  Head: Normocephalic and atraumatic.  Mouth/Throat: Oropharynx is clear and moist.  Eyes: Conjunctivae and EOM are normal. Pupils are equal, round, and reactive to light.  Neck: Normal range of motion. Neck supple.  Cardiovascular: Normal rate, regular rhythm, normal heart sounds and intact distal pulses.  Exam reveals no gallop and no friction rub.   No murmur heard. Pulmonary/Chest: Effort normal and breath sounds normal. No respiratory distress.  Abdominal: Soft. Bowel sounds are normal. There is no  tenderness. There is no rebound and no guarding.  Genitourinary: Vagina normal. No vaginal discharge found.       Cervix closed with no effacement  Musculoskeletal:       Lumbar back: She exhibits decreased range of motion, tenderness, pain and spasm. She exhibits no bony tenderness, no swelling and no deformity.       Back:       No coccygeal pain  Neurological: She is alert and oriented to person, place, and time. She has normal strength and normal reflexes. No cranial nerve deficit.  Reflex Scores:      Patellar reflexes are 2+ on the right side and 2+ on the left side.      Achilles reflexes are 2+ on the right side and 2+ on the left side. Skin: Skin is warm and dry. No rash noted.    ED Course  Procedures (including critical care time)  Labs Reviewed  URINALYSIS, ROUTINE W REFLEX MICROSCOPIC - Abnormal; Notable for the following:    APPearance HAZY (*)    Specific Gravity, Urine >1.030 (*)    Protein, ur 30 (*)    All other components within normal limits  URINE MICROSCOPIC-ADD ON - Abnormal; Notable for the following:    Bacteria, UA FEW (*)    Crystals CA OXALATE CRYSTALS (*)    All other components within normal limits  PREGNANCY, URINE   Dg Lumbar Spine Complete  09/03/2011  *RADIOLOGY REPORT*  Clinical Data: Fall, low back pain  LUMBAR SPINE - COMPLETE 4+ VIEW  Comparison: 06/25/2011  Findings: Five lumbar-type vertebral bodies.  Normal lumbar lordosis.  No evidence of fracture or dislocation.  The vertebral body heights are maintained.  Mild multilevel degenerative changes, most prominent at L5-S1.  Cholecystectomy clips.  IMPRESSION: No fracture or dislocation is seen.  Mild multilevel degenerative changes.  Original Report Authenticated By: Charline Bills, M.D.     1. Lumbosacral strain       MDM  Imaging in urine negative. There is no concern for cauda equina or additional causes of malignant back pain. There is no indication for fracture given the  appearance of her plain films. She'll be discharged home with anti-inflammatory and pain medication. Pain was controlled adequately in the emergency department. She is instructed to followup with sports medicine as needed in one week. Instructed to ice for the first 2 days and heat thereafter several times daily.        Dayton Bailiff, MD 09/03/11 1922  I was called by radiology informed me that there was a fetus on the lumbosacral plain film. Her initial HCG testing by a urine was negative therefore I thought pregnancy was ruled out. I confronted the patient she stated that her urine was provided by a family member. Urine was repeated and she was in fact positive. She fell she had been having menstrual periods for the past several months. Her uterine fundus was just inferior to the umbilicus. On bedside ultrasound examination there was a heartbeat I  was unable to determine the exact gestational age. She does have an OB/GYN as she has a 54-year-old child. She states she will followup with his physician tomorrow. I have no concern for imminent child delivery at this time. She states this is not contractions that this is muscular pain. She had no pain until this fall. She received a dose of pain medication and was monitored in the emergency department. This time until she is safe for discharge to home  Dayton Bailiff, MD 09/03/11 2007

## 2011-09-03 NOTE — ED Notes (Signed)
Pt reports slipped on wet steps this afternoon and fell onto buttocks.  C/O pain to lower back.  Says pain is making her nauseated.

## 2011-11-10 ENCOUNTER — Encounter (HOSPITAL_COMMUNITY): Payer: Self-pay

## 2011-11-10 ENCOUNTER — Emergency Department (HOSPITAL_COMMUNITY)
Admission: EM | Admit: 2011-11-10 | Discharge: 2011-11-10 | Disposition: A | Discharge status: Home / Self Care | Payer: Medicaid Other | Attending: Emergency Medicine | Admitting: Emergency Medicine

## 2011-11-10 DIAGNOSIS — G8929 Other chronic pain: Secondary | ICD-10-CM | POA: Insufficient documentation

## 2011-11-10 DIAGNOSIS — M545 Low back pain, unspecified: Secondary | ICD-10-CM | POA: Insufficient documentation

## 2011-11-10 DIAGNOSIS — Z331 Pregnant state, incidental: Secondary | ICD-10-CM

## 2011-11-10 DIAGNOSIS — O99891 Other specified diseases and conditions complicating pregnancy: Secondary | ICD-10-CM | POA: Insufficient documentation

## 2011-11-10 HISTORY — DX: Other chronic pain: G89.29

## 2011-11-10 HISTORY — DX: Dorsalgia, unspecified: M54.9

## 2011-11-10 LAB — URINALYSIS, ROUTINE W REFLEX MICROSCOPIC
Hgb urine dipstick: NEGATIVE
Leukocytes, UA: NEGATIVE
Nitrite: NEGATIVE
Protein, ur: NEGATIVE mg/dL
Specific Gravity, Urine: 1.02 (ref 1.005–1.030)
Urobilinogen, UA: 0.2 mg/dL (ref 0.0–1.0)

## 2011-11-10 LAB — POCT PREGNANCY, URINE: Preg Test, Ur: POSITIVE — AB

## 2011-11-10 MED ORDER — ACETAMINOPHEN-CODEINE #3 300-30 MG PO TABS: 1.0000 | ORAL_TABLET | Freq: Four times a day (QID) | ORAL | Status: AC | PRN

## 2011-11-10 NOTE — Discharge Instructions (Signed)
RESOURCE GUIDE  Dental Problems  Patients with Medicaid: [facility]                     [facility] [address]                                           [address] Phone:  [phone]                                                  Phone:  [phone]  If unable to pay or uninsured, contact:  [facility] or [facility] to become qualified for the adult dental clinic.  Chronic Pain Problems Contact [facility]  [phone] Patients need to be referred by their primary care doctor.  Insufficient Money for Medicine Contact [employer]:  call "211" or [facility] 271-5999.  No Primary Care Doctor Call [facility]  832-8000 Other agencies that provide inexpensive medical care    [facility]  [phone]    [facility]  [phone]    [facility]  [phone]    Women's Clinic  [phone]    [facility]  [phone]    [facility]  [phone]  Psychological Services [facility]  [phone] [facility]  [phone] [facility]   [phone] (emergency services [phone])  Substance Abuse Resources [facility]  336-882-2125 [facility] 336-784-9470 [facility] 336-285-9073 [facility] 336-845-3988 Residential & Outpatient Substance Abuse Program  800-659-3381  Abuse/Neglect [place] Child Abuse Hotline (336) 641-3795 [place] Child Abuse Hotline 800-378-5315 (After Hours)  Emergency Shelter [facility] (336) 271-5985  Maternity Homes [facility] (336) 275-9566 [facility] (704) 372-4663  MRSA Hotline #:   832-7006    [place] [facility]  [facility] [place]     [employer]                          [facility] [address] [place]                       [address]      [address]  [place]                                                [place]                            [place] Phone:  [phone]                                   Phone:  [phone]                 Phone:  [phone]  [facility] Phone:  [phone]    [facility] Child Abuse Hotline (240)371-3531 314-328-6371 (After Hours)    Take the prescription as directed.  Apply moist heat or ice to the area(s) of discomfort, for 15 minutes at a time, several times per day for the next few days.  Do not fall asleep on a heating or ice pack.  Call your regular medical doctor or your OB/GYN doctor on Monday morning to schedule a follow up appointment this week.  Return to the Emergency Department immediately if worsening.

## 2011-11-10 NOTE — ED Notes (Signed)
Pt states she was moving boxes and she states "she felt something pull"

## 2011-11-10 NOTE — ED Notes (Signed)
Pt alert & oriented x4, stable gait. Pt given discharge instructions, paperwork & prescription(s). Patient instructed to stop at the registration desk to finish any additional paperwork. pt verbalized understanding. Pt left department w/ no further questions.  

## 2011-11-10 NOTE — ED Provider Notes (Signed)
History     CSN: [id number]  Arrival date & time 11/10/11  1659   First MD Initiated Contact with Patient 11/10/11 1722      Chief Complaint  Patient presents with  . Back Pain     HPI Pt was seen at 1830.  Per pt, c/o gradual onset and persistence of constant acute flair of her chronic low back "pain" since this morning.  States she was moving boxes and she felt a "pull" in her left lower back area.  Denies any change in her usual chronic pain pattern for the past several years.  Denies incont/retention of bowel or bladder, no saddle anesthesia, no focal motor weakness, no tingling/numbness in extremities, no fevers, no direct injury.   The symptoms have been associated with no other complaints. The patient has a significant history of similar symptoms previously, recently being evaluated for this complaint and multiple prior evals for same.  Pt also denies abd/pelvic pain, no contractions, no LOF, no vaginal bleeding or discharge.    Past Medical History  Diagnosis Date  . Chronic back pain     Past Surgical History  Procedure Date  . Cesarean section   . Cholecystectomy     History  Substance Use Topics  . Smoking status: Current Everyday Smoker -- 0.5 packs/day    Types: Cigarettes  . Smokeless tobacco: Not on file  . Alcohol Use: No    OB History    Grav Para Term Preterm Abortions TAB SAB Ect Mult Living   3 2        2       Review of Systems ROS: Statement: All systems negative except as marked or noted in the HPI; Constitutional: Negative for fever and chills. ; ; Eyes: Negative for eye pain, redness and discharge. ; ; ENMT: Negative for ear pain, hoarseness, nasal congestion, sinus pressure and sore throat. ; ; Cardiovascular: Negative for chest pain, palpitations, diaphoresis, dyspnea and peripheral edema. ; ; Respiratory: Negative for cough, wheezing and stridor. ; ; Gastrointestinal: Negative for nausea, vomiting, diarrhea, abdominal pain, blood in stool,  hematemesis, jaundice and rectal bleeding. . ; ; Genitourinary: Negative for dysuria, flank pain and hematuria. ;  GYN:  No pelvic pain/cramping, no vaginal bleeding, no vaginal discharge, no LOF, no vulvar pain.;; Musculoskeletal: +LBP. Negative for neck pain. Negative for swelling and trauma.; ; Skin: Negative for pruritus, rash, abrasions, blisters, bruising and skin lesion.; ; Neuro: Negative for headache, lightheadedness and neck stiffness. Negative for weakness, altered level of consciousness , altered mental status, extremity weakness, paresthesias, involuntary movement, seizure and syncope.       Allergies  Flexeril; Other; and Skelaxin  Home Medications   Current Outpatient Rx  Name Route Sig Dispense Refill  . IBUPROFEN 200 MG PO TABS Oral Take 400 mg by mouth every 8 (eight) hours as needed. pain    . PRENATAL COMPLETE 14-0.4 MG PO TABS Oral Take 1 tablet by mouth daily. 60 each 0    BP 124/68  Pulse 111  Temp(Src) 97.5 F (36.4 C) (Oral)  Ht 5\' 7"  (1.702 m)  Wt 214 lb (97.07 kg)  BMI 33.52 kg/m2  SpO2 99%  LMP 08/30/2011  Physical Exam 1835:  Physical examination:  Nursing notes reviewed; Vital signs and O2 SAT reviewed;  Constitutional: Well developed, Well nourished, Well hydrated, In no acute distress; Head:  Normocephalic, atraumatic; Eyes: EOMI, PERRL, No scleral icterus; ENMT: Mouth and pharynx normal, Mucous membranes moist; Neck: Supple, Full range of  motion, No lymphadenopathy; Cardiovascular: Regular rate and rhythm, No murmur, rub, or gallop; Respiratory: Breath sounds clear & equal bilaterally, No rales, rhonchi, wheezes, or rub, Normal respiratory effort/excursion; Chest: Nontender, Movement normal; Abdomen:  +gravid, Nontender, Normal bowel sounds; Genitourinary: No CVA tenderness; Spine:  No midline CS, TS, LS tenderness. +TTP left lumbar paraspinal muscles;  Extremities: Pulses normal, No tenderness, No edema, No calf edema or asymmetry.; Neuro: AA&Ox3, Major  CN grossly intact. Strength 5/5 equal bilat UE's and LE's, including great toe dorsiflexion.  DTR 2/4 equal bilat UE's and LE's.  No gross sensory deficits.  Neg straight leg raises bilat.  Gait steady.; Skin: Color normal, Warm, Dry.   ED Course  Procedures  MDM  MDM Reviewed: previous chart, nursing note and vitals Interpretation: labs   Results for orders placed during the hospital encounter of 11/10/11  URINALYSIS, ROUTINE W REFLEX MICROSCOPIC      Component Value Range   Color, Urine YELLOW  YELLOW    APPearance CLEAR  CLEAR    Specific Gravity, Urine 1.020  1.005 - 1.030    pH 5.5  5.0 - 8.0    Glucose, UA NEGATIVE  NEGATIVE (mg/dL)   Hgb urine dipstick NEGATIVE  NEGATIVE    Bilirubin Urine NEGATIVE  NEGATIVE    Ketones, ur NEGATIVE  NEGATIVE (mg/dL)   Protein, ur NEGATIVE  NEGATIVE (mg/dL)   Urobilinogen, UA 0.2  0.0 - 1.0 (mg/dL)   Nitrite NEGATIVE  NEGATIVE    Leukocytes, UA NEGATIVE  NEGATIVE   POCT PREGNANCY, URINE      Component Value Range   Preg Test, Ur POSITIVE (*) NEGATIVE      7:05 PM:  Appears acute flair of her chronic pain today.  Pt has been to ED several times for same, including 3 times during this pregnancy.  On those visits, per EPIC chart review, it does not appear that she told her ED providers about her pregnancy and has received IM narcotics (dilaudid), motrin, and rx narcotics (vicodin, percocet).  Admits to pregnancy when confronted today, endorsing she is "8 months" pregnant and has been seen by her OB/GYN Emelda Fear within the past few weeks.  FHT 145 today.  States she is "allergic to all muscle relaxants."  Pt aware I will only rx T#3 today, and she will have to contact her OB/GYN for any other narcotic pain meds.  Verb understanding.              Laray Anger, DO 11/12/11 1349

## 2011-11-27 LAB — STREP B DNA PROBE: GBS: POSITIVE

## 2011-12-05 ENCOUNTER — Encounter (HOSPITAL_COMMUNITY): Payer: Self-pay | Admitting: Obstetrics and Gynecology

## 2011-12-05 DIAGNOSIS — J45909 Unspecified asthma, uncomplicated: Secondary | ICD-10-CM

## 2011-12-05 NOTE — H&P (Signed)
Victoria Terrell is a 31 y.o. female presenting for Repeat cesarean Section and tubal ligation on Thursday, December 13, 2011 at 2:30 pm. History at 70 wks gest age OB History    Grav Para Term Preterm Abortions TAB SAB Ect Mult Living   3 2 2       2      Past Medical History  Diagnosis Date  . Chronic back pain     panic attacks  . Asthma   . H/O: drug dependency     +  UDS early preg for THC, Benzo's, Opiates,gets Rx's from dentists,others   Past Surgical History  Procedure Date  . Cesarean section   . Cholecystectomy    Family History: family history is not on file. Social History:  reports that she has been smoking Cigarettes.  She has been smoking about 1 pack per day. She does not have any smokeless tobacco history on file. She reports that she uses illicit drugs (Other-see comments). She reports that she does not drink alcohol.  ROSseen in Phys Tx this pregnancy in referrral for chronic backache   Last menstrual period 08/30/2011, unknown if currently breastfeeding.Marland Kitchen AType APOS, Rubella -,Hgb 12.2. Platelets 240k, HIV +screen with NEGATIVE western Blot Pap class I, MSAFP IT negative, 2 hr GTT 78/98/85 Exam Physical Exam Physical Examination: General appearance - alert, well appearing, and in no distress, oriented to person, place, and time, overweight and well hydrated Mental status - alert, oriented to person, place, and time, normal mood, behavior, speech, dress, motor activity, and thought processes Mouth - good dental status Chest - clear Heart - normal rate, regular rhythm, normal S1, S2, no murmurs, rubs, clicks or gallops, S1 and S2 normal Abdomen - jGravid uterus c/w dates, Pfannensteil incision well healed   Pelvic - normal external genitalia, vulva, vagina, cervix, uterus and adnexa  Cervix packg   ABO, Rh:  A Pos Antibody:  Negative    Rubella:  immune  RPR:   NR HBsAg:   Neg HIV:   Pos screen with negative Western Blot  GBS:    positive  Assessment/Plan: Pregnancy 39 weeks, gestation, prior Cesarean x 2 for repeat Cesarean and Tubal ligaiton   Victoria Terrell V 12/05/2011, 6:47 PM

## 2011-12-06 ENCOUNTER — Encounter (HOSPITAL_COMMUNITY): Payer: Self-pay | Admitting: Pharmacist

## 2011-12-10 ENCOUNTER — Encounter (HOSPITAL_COMMUNITY): Payer: Self-pay

## 2011-12-11 ENCOUNTER — Encounter (HOSPITAL_COMMUNITY): Payer: Self-pay

## 2011-12-11 ENCOUNTER — Encounter (HOSPITAL_COMMUNITY)
Admission: RE | Admit: 2011-12-11 | Discharge: 2011-12-11 | Disposition: A | Discharge status: Home / Self Care | Payer: Medicaid Other | Source: Ambulatory Visit | Attending: Obstetrics and Gynecology | Admitting: Obstetrics and Gynecology

## 2011-12-11 ENCOUNTER — Encounter (HOSPITAL_COMMUNITY): Payer: Self-pay | Admitting: Obstetrics and Gynecology

## 2011-12-11 DIAGNOSIS — Z302 Encounter for sterilization: Secondary | ICD-10-CM

## 2011-12-11 DIAGNOSIS — O34219 Maternal care for unspecified type scar from previous cesarean delivery: Secondary | ICD-10-CM

## 2011-12-11 HISTORY — DX: Gastro-esophageal reflux disease without esophagitis: K21.9

## 2011-12-11 HISTORY — DX: Shortness of breath: R06.02

## 2011-12-11 HISTORY — DX: Other intervertebral disc degeneration, lumbar region: M51.36

## 2011-12-11 HISTORY — DX: Anxiety disorder, unspecified: F41.9

## 2011-12-11 HISTORY — DX: Other intervertebral disc displacement, lumbar region: M51.26

## 2011-12-11 HISTORY — DX: Unspecified convulsions: R56.9

## 2011-12-11 HISTORY — DX: Cough: R05

## 2011-12-11 HISTORY — DX: Chronic cough: R05.3

## 2011-12-11 HISTORY — DX: Depression, unspecified: F32.A

## 2011-12-11 HISTORY — DX: Major depressive disorder, single episode, unspecified: F32.9

## 2011-12-11 HISTORY — DX: Other intervertebral disc degeneration, lumbar region without mention of lumbar back pain or lower extremity pain: M51.369

## 2011-12-11 LAB — CBC
MCH: 29.5 pg (ref 26.0–34.0)
Platelets: 222 10*3/uL (ref 150–400)
RBC: 3.49 MIL/uL — ABNORMAL LOW (ref 3.87–5.11)
RDW: 13.5 % (ref 11.5–15.5)
WBC: 7.4 10*3/uL (ref 4.0–10.5)

## 2011-12-11 LAB — SURGICAL PCR SCREEN
MRSA, PCR: NEGATIVE
Staphylococcus aureus: NEGATIVE

## 2011-12-11 NOTE — Patient Instructions (Signed)
YOUR PROCEDURE IS SCHEDULED ON: 12/13/11  ENTER THROUGH THE MAIN ENTRANCE OF [facility] AT:1pm  USE DESK PHONE AND DIAL [phone] TO INFORM us OF YOUR ARRIVAL  CALL [phone] IF YOU HAVE ANY QUESTIONS OR PROBLEMS PRIOR TO YOUR ARRIVAL.  REMEMBER: DO NOT EAT AFTER MIDNIGHT :Wed SPECIAL INSTRUCTIONS:clear liquids ok until 10 am on Thursday   YOU MAY BRUSH YOUR TEETH THE MORNING OF SURGERY   TAKE THESE MEDICINES THE DAY OF SURGERY WITH SIP OF WATER:none   DO NOT WEAR JEWELRY, EYE MAKEUP, LIPSTICK OR DARK FINGERNAIL POLISH DO NOT WEAR LOTIONS  DO NOT SHAVE FOR 48 HOURS PRIOR TO SURGERY  YOU WILL NOT BE ALLOWED TO DRIVE YOURSELF HOME.  NAME OF DRIVER: Misty Stanley

## 2011-12-13 ENCOUNTER — Encounter (HOSPITAL_COMMUNITY): Payer: Self-pay | Admitting: Anesthesiology

## 2011-12-13 ENCOUNTER — Inpatient Hospital Stay (HOSPITAL_COMMUNITY)
Admission: RE | Admit: 2011-12-13 | Discharge: 2011-12-16 | DRG: 766 | Disposition: A | Discharge status: Home / Self Care | Payer: Medicaid Other | Source: Ambulatory Visit | Attending: Obstetrics and Gynecology | Admitting: Obstetrics and Gynecology

## 2011-12-13 ENCOUNTER — Encounter (HOSPITAL_COMMUNITY): Payer: Self-pay | Admitting: *Deleted

## 2011-12-13 ENCOUNTER — Encounter (HOSPITAL_COMMUNITY): Payer: Self-pay

## 2011-12-13 ENCOUNTER — Encounter (HOSPITAL_COMMUNITY)
Admission: RE | Disposition: A | Discharge status: Home / Self Care | Payer: Self-pay | Source: Ambulatory Visit | Attending: Obstetrics and Gynecology

## 2011-12-13 ENCOUNTER — Ambulatory Visit (HOSPITAL_COMMUNITY): Payer: Medicaid Other

## 2011-12-13 DIAGNOSIS — Z98891 History of uterine scar from previous surgery: Secondary | ICD-10-CM

## 2011-12-13 DIAGNOSIS — O34219 Maternal care for unspecified type scar from previous cesarean delivery: Principal | ICD-10-CM | POA: Diagnosis present

## 2011-12-13 DIAGNOSIS — Z01812 Encounter for preprocedural laboratory examination: Secondary | ICD-10-CM

## 2011-12-13 DIAGNOSIS — Z302 Encounter for sterilization: Secondary | ICD-10-CM

## 2011-12-13 DIAGNOSIS — O9989 Other specified diseases and conditions complicating pregnancy, childbirth and the puerperium: Secondary | ICD-10-CM

## 2011-12-13 DIAGNOSIS — Z22330 Carrier of Group B streptococcus: Secondary | ICD-10-CM

## 2011-12-13 DIAGNOSIS — O99892 Other specified diseases and conditions complicating childbirth: Secondary | ICD-10-CM | POA: Diagnosis present

## 2011-12-13 DIAGNOSIS — J45909 Unspecified asthma, uncomplicated: Secondary | ICD-10-CM

## 2011-12-13 DIAGNOSIS — Z01818 Encounter for other preprocedural examination: Secondary | ICD-10-CM

## 2011-12-13 HISTORY — DX: Other psychoactive substance dependence, in remission: F19.21

## 2011-12-13 LAB — TYPE AND SCREEN: Antibody Screen: NEGATIVE

## 2011-12-13 SURGERY — Surgical Case
Anesthesia: Regional | Site: Abdomen | Laterality: Bilateral | Wound class: Clean Contaminated

## 2011-12-13 MED ORDER — MORPHINE SULFATE 0.5 MG/ML IJ SOLN
INTRAMUSCULAR | Status: AC
  Filled 2011-12-13: qty 10

## 2011-12-13 MED ORDER — KETOROLAC TROMETHAMINE 30 MG/ML IJ SOLN: 30.0000 mg | Freq: Four times a day (QID) | INTRAMUSCULAR | Status: AC | PRN

## 2011-12-13 MED ORDER — NALOXONE HCL 0.4 MG/ML IJ SOLN: 0.4000 mg | INTRAMUSCULAR | Status: DC | PRN

## 2011-12-13 MED ORDER — OXYTOCIN 20 UNITS IN LACTATED RINGERS INFUSION - SIMPLE
INTRAVENOUS | Status: AC
  Administered 2011-12-13: 125 mL/h via INTRAVENOUS
  Filled 2011-12-13: qty 1000

## 2011-12-13 MED ORDER — DIPHENHYDRAMINE HCL 12.5 MG/5ML PO ELIX
12.5000 mg | ORAL_SOLUTION | Freq: Four times a day (QID) | ORAL | Status: DC | PRN
  Filled 2011-12-13: qty 5

## 2011-12-13 MED ORDER — SENNOSIDES-DOCUSATE SODIUM 8.6-50 MG PO TABS
2.0000 | ORAL_TABLET | Freq: Every day | ORAL | Status: DC
  Administered 2011-12-13 – 2011-12-15 (×2): 2 via ORAL

## 2011-12-13 MED ORDER — ONDANSETRON HCL 4 MG PO TABS: 4.0000 mg | ORAL_TABLET | ORAL | Status: DC | PRN

## 2011-12-13 MED ORDER — PHENYLEPHRINE 40 MCG/ML (10ML) SYRINGE FOR IV PUSH (FOR BLOOD PRESSURE SUPPORT)
PREFILLED_SYRINGE | INTRAVENOUS | Status: AC
  Filled 2011-12-13: qty 5

## 2011-12-13 MED ORDER — DIPHENHYDRAMINE HCL 25 MG PO CAPS: 25.0000 mg | ORAL_CAPSULE | ORAL | Status: DC | PRN

## 2011-12-13 MED ORDER — KETOROLAC TROMETHAMINE 60 MG/2ML IM SOLN
INTRAMUSCULAR | Status: AC
  Administered 2011-12-13: 60 mg via INTRAMUSCULAR
  Filled 2011-12-13: qty 2

## 2011-12-13 MED ORDER — LANOLIN HYDROUS EX OINT: 1.0000 "application " | TOPICAL_OINTMENT | CUTANEOUS | Status: DC | PRN

## 2011-12-13 MED ORDER — DIPHENHYDRAMINE HCL 50 MG/ML IJ SOLN: 25.0000 mg | INTRAMUSCULAR | Status: DC | PRN

## 2011-12-13 MED ORDER — SODIUM CHLORIDE 0.9 % IJ SOLN: 9.0000 mL | INTRAMUSCULAR | Status: DC | PRN

## 2011-12-13 MED ORDER — NALBUPHINE HCL 10 MG/ML IJ SOLN: 5.0000 mg | INTRAMUSCULAR | Status: DC | PRN

## 2011-12-13 MED ORDER — MEPERIDINE HCL 25 MG/ML IJ SOLN: 6.2500 mg | INTRAMUSCULAR | Status: DC | PRN

## 2011-12-13 MED ORDER — ONDANSETRON HCL 4 MG/2ML IJ SOLN: 4.0000 mg | INTRAMUSCULAR | Status: DC | PRN

## 2011-12-13 MED ORDER — SIMETHICONE 80 MG PO CHEW
80.0000 mg | CHEWABLE_TABLET | Freq: Three times a day (TID) | ORAL | Status: DC
  Administered 2011-12-13 – 2011-12-15 (×6): 80 mg via ORAL

## 2011-12-13 MED ORDER — MORPHINE SULFATE (PF) 1 MG/ML IV SOLN
INTRAVENOUS | Status: DC
  Administered 2011-12-13: 9 mg via INTRAVENOUS
  Administered 2011-12-14: 10 mg via INTRAVENOUS
  Administered 2011-12-14: 04:00:00 via INTRAVENOUS
  Administered 2011-12-14: 11.45 mg via INTRAVENOUS
  Administered 2011-12-14: 3.5 mg via INTRAVENOUS
  Filled 2011-12-13 (×2): qty 25

## 2011-12-13 MED ORDER — HYDROMORPHONE HCL PF 1 MG/ML IJ SOLN
INTRAMUSCULAR | Status: AC
  Administered 2011-12-13: 0.5 mg via INTRAVENOUS
  Filled 2011-12-13: qty 1

## 2011-12-13 MED ORDER — MENTHOL 3 MG MT LOZG: 1.0000 | LOZENGE | OROMUCOSAL | Status: DC | PRN

## 2011-12-13 MED ORDER — DIPHENHYDRAMINE HCL 25 MG PO CAPS: 25.0000 mg | ORAL_CAPSULE | Freq: Four times a day (QID) | ORAL | Status: DC | PRN

## 2011-12-13 MED ORDER — EPHEDRINE 5 MG/ML INJ
INTRAVENOUS | Status: AC
  Filled 2011-12-13: qty 10

## 2011-12-13 MED ORDER — SODIUM CHLORIDE 0.9 % IJ SOLN: 3.0000 mL | INTRAMUSCULAR | Status: DC | PRN

## 2011-12-13 MED ORDER — TETANUS-DIPHTH-ACELL PERTUSSIS 5-2.5-18.5 LF-MCG/0.5 IM SUSP
0.5000 mL | Freq: Once | INTRAMUSCULAR | Status: AC
  Administered 2011-12-14: 0.5 mL via INTRAMUSCULAR
  Filled 2011-12-13: qty 0.5

## 2011-12-13 MED ORDER — PHENYLEPHRINE HCL 10 MG/ML IJ SOLN
INTRAMUSCULAR | Status: DC | PRN
  Administered 2011-12-13: 40 ug via INTRAVENOUS
  Administered 2011-12-13: 80 ug via INTRAVENOUS
  Administered 2011-12-13 (×3): 40 ug via INTRAVENOUS
  Administered 2011-12-13 (×3): 120 ug via INTRAVENOUS
  Administered 2011-12-13: 80 ug via INTRAVENOUS

## 2011-12-13 MED ORDER — OXYTOCIN 20 UNITS IN LACTATED RINGERS INFUSION - SIMPLE
125.0000 mL/h | INTRAVENOUS | Status: AC
  Administered 2011-12-13: 125 mL/h via INTRAVENOUS

## 2011-12-13 MED ORDER — WITCH HAZEL-GLYCERIN EX PADS: 1.0000 "application " | MEDICATED_PAD | CUTANEOUS | Status: DC | PRN

## 2011-12-13 MED ORDER — ONDANSETRON HCL 4 MG/2ML IJ SOLN
4.0000 mg | Freq: Three times a day (TID) | INTRAMUSCULAR | Status: DC | PRN
Start: 2011-12-13 — End: 2011-12-16

## 2011-12-13 MED ORDER — DIPHENHYDRAMINE HCL 50 MG/ML IJ SOLN: 12.5000 mg | Freq: Four times a day (QID) | INTRAMUSCULAR | Status: DC | PRN

## 2011-12-13 MED ORDER — MORPHINE SULFATE (PF) 0.5 MG/ML IJ SOLN
INTRAMUSCULAR | Status: DC | PRN
  Administered 2011-12-13: .15 mg via INTRATHECAL

## 2011-12-13 MED ORDER — ZOLPIDEM TARTRATE 5 MG PO TABS: 5.0000 mg | ORAL_TABLET | Freq: Every evening | ORAL | Status: DC | PRN

## 2011-12-13 MED ORDER — OXYTOCIN 10 UNIT/ML IJ SOLN
INTRAMUSCULAR | Status: AC
  Filled 2011-12-13: qty 2

## 2011-12-13 MED ORDER — PRENATAL MULTIVITAMIN CH
1.0000 | ORAL_TABLET | Freq: Every day | ORAL | Status: DC
  Administered 2011-12-14 – 2011-12-16 (×3): 1 via ORAL
  Filled 2011-12-13 (×3): qty 1

## 2011-12-13 MED ORDER — PROPOFOL 10 MG/ML IV EMUL
INTRAVENOUS | Status: AC
  Filled 2011-12-13: qty 20

## 2011-12-13 MED ORDER — OXYTOCIN 10 UNIT/ML IJ SOLN
INTRAMUSCULAR | Status: DC | PRN
  Administered 2011-12-13 (×2): 20 [IU] via INTRAMUSCULAR

## 2011-12-13 MED ORDER — KETOROLAC TROMETHAMINE 60 MG/2ML IM SOLN
60.0000 mg | Freq: Once | INTRAMUSCULAR | Status: AC | PRN
  Administered 2011-12-13: 60 mg via INTRAMUSCULAR

## 2011-12-13 MED ORDER — FENTANYL CITRATE 0.05 MG/ML IJ SOLN
INTRAMUSCULAR | Status: AC
  Filled 2011-12-13: qty 2

## 2011-12-13 MED ORDER — DIPHENHYDRAMINE HCL 50 MG/ML IJ SOLN: 12.5000 mg | INTRAMUSCULAR | Status: DC | PRN

## 2011-12-13 MED ORDER — HYDROMORPHONE HCL PF 1 MG/ML IJ SOLN
0.2500 mg | INTRAMUSCULAR | Status: DC | PRN
  Administered 2011-12-13 (×4): 0.5 mg via INTRAVENOUS

## 2011-12-13 MED ORDER — DIBUCAINE 1 % RE OINT: 1.0000 "application " | TOPICAL_OINTMENT | RECTAL | Status: DC | PRN

## 2011-12-13 MED ORDER — LACTATED RINGERS IV SOLN
INTRAVENOUS | Status: DC
  Administered 2011-12-14: 01:00:00 via INTRAVENOUS

## 2011-12-13 MED ORDER — IBUPROFEN 600 MG PO TABS
600.0000 mg | ORAL_TABLET | Freq: Four times a day (QID) | ORAL | Status: DC
  Administered 2011-12-14 – 2011-12-16 (×11): 600 mg via ORAL
  Filled 2011-12-13 (×11): qty 1

## 2011-12-13 MED ORDER — ONDANSETRON HCL 4 MG/2ML IJ SOLN: 4.0000 mg | Freq: Four times a day (QID) | INTRAMUSCULAR | Status: DC | PRN

## 2011-12-13 MED ORDER — ONDANSETRON HCL 4 MG/2ML IJ SOLN
INTRAMUSCULAR | Status: DC | PRN
  Administered 2011-12-13: 4 mg via INTRAVENOUS

## 2011-12-13 MED ORDER — FENTANYL CITRATE 0.05 MG/ML IJ SOLN
INTRAMUSCULAR | Status: DC | PRN
  Administered 2011-12-13: 25 ug via INTRATHECAL

## 2011-12-13 MED ORDER — SIMETHICONE 80 MG PO CHEW
80.0000 mg | CHEWABLE_TABLET | ORAL | Status: DC | PRN
  Administered 2011-12-15 – 2011-12-16 (×2): 80 mg via ORAL

## 2011-12-13 MED ORDER — LACTATED RINGERS IV SOLN
INTRAVENOUS | Status: DC
  Administered 2011-12-13: 50 mL/h via INTRAVENOUS
  Administered 2011-12-13 (×2): via INTRAVENOUS
  Administered 2011-12-13: 50 mL/h via INTRAVENOUS
  Administered 2011-12-13: 16:00:00 via INTRAVENOUS

## 2011-12-13 MED ORDER — IBUPROFEN 600 MG PO TABS: 600.0000 mg | ORAL_TABLET | Freq: Four times a day (QID) | ORAL | Status: DC | PRN

## 2011-12-13 MED ORDER — OXYCODONE-ACETAMINOPHEN 5-325 MG PO TABS
1.0000 | ORAL_TABLET | ORAL | Status: DC | PRN
  Administered 2011-12-14 (×3): 1 via ORAL
  Administered 2011-12-14: 2 via ORAL
  Administered 2011-12-14: 1 via ORAL
  Administered 2011-12-15: 2 via ORAL
  Administered 2011-12-15: 1 via ORAL
  Administered 2011-12-15 – 2011-12-16 (×4): 2 via ORAL
  Filled 2011-12-13: qty 2
  Filled 2011-12-13: qty 1
  Filled 2011-12-13 (×2): qty 2
  Filled 2011-12-13: qty 1
  Filled 2011-12-13: qty 2
  Filled 2011-12-13: qty 1
  Filled 2011-12-13 (×5): qty 2
  Filled 2011-12-13: qty 1

## 2011-12-13 MED ORDER — SODIUM CHLORIDE 0.9 % IV SOLN: 1.0000 ug/kg/h | INTRAVENOUS | Status: DC | PRN

## 2011-12-13 MED ORDER — SCOPOLAMINE 1 MG/3DAYS TD PT72: 1.0000 | MEDICATED_PATCH | Freq: Once | TRANSDERMAL | Status: DC

## 2011-12-13 MED ORDER — CEFAZOLIN SODIUM-DEXTROSE 2-3 GM-% IV SOLR
2.0000 g | Freq: Once | INTRAVENOUS | Status: AC
  Administered 2011-12-13: 2 g via INTRAVENOUS
  Filled 2011-12-13: qty 50

## 2011-12-13 MED ORDER — SCOPOLAMINE 1 MG/3DAYS TD PT72
1.0000 | MEDICATED_PATCH | Freq: Once | TRANSDERMAL | Status: DC
  Administered 2011-12-13: 1.5 mg via TRANSDERMAL

## 2011-12-13 MED ORDER — METOCLOPRAMIDE HCL 5 MG/ML IJ SOLN: 10.0000 mg | Freq: Three times a day (TID) | INTRAMUSCULAR | Status: DC | PRN

## 2011-12-13 MED ORDER — EPHEDRINE SULFATE 50 MG/ML IJ SOLN
INTRAMUSCULAR | Status: DC | PRN
  Administered 2011-12-13 (×4): 5 mg via INTRAVENOUS
  Administered 2011-12-13 (×2): 10 mg via INTRAVENOUS
  Administered 2011-12-13: 5 mg via INTRAVENOUS
  Administered 2011-12-13: 10 mg via INTRAVENOUS
  Administered 2011-12-13: 15 mg via INTRAVENOUS

## 2011-12-13 MED ORDER — SCOPOLAMINE 1 MG/3DAYS TD PT72
MEDICATED_PATCH | TRANSDERMAL | Status: AC
  Administered 2011-12-13: 1.5 mg via TRANSDERMAL
  Filled 2011-12-13: qty 1

## 2011-12-13 SURGICAL SUPPLY — 29 items
BENZOIN TINCTURE PRP APPL 2/3 (GAUZE/BANDAGES/DRESSINGS) IMPLANT
CLIP FILSHIE TUBAL LIGA STRL (Clip) ×2 IMPLANT
CLOTH BEACON ORANGE TIMEOUT ST (SAFETY) ×2 IMPLANT
ELECT REM PT RETURN 9FT ADLT (ELECTROSURGICAL) ×2
ELECTRODE REM PT RTRN 9FT ADLT (ELECTROSURGICAL) ×1 IMPLANT
EXTRACTOR VACUUM KIWI (MISCELLANEOUS) IMPLANT
GLOVE BIO SURGEON ST LM GN SZ9 (GLOVE) ×2 IMPLANT
GLOVE BIOGEL PI IND STRL 9 (GLOVE) ×2 IMPLANT
GLOVE BIOGEL PI INDICATOR 9 (GLOVE) ×2
GOWN PREVENTION PLUS LG XLONG (DISPOSABLE) ×2 IMPLANT
GOWN PREVENTION PLUS XXLARGE (GOWN DISPOSABLE) ×2 IMPLANT
GOWN STRL REIN 3XL LVL4 (GOWN DISPOSABLE) ×2 IMPLANT
NEEDLE HYPO 25X5/8 SAFETYGLIDE (NEEDLE) IMPLANT
NS IRRIG 1000ML POUR BTL (IV SOLUTION) ×2 IMPLANT
PACK C SECTION WH (CUSTOM PROCEDURE TRAY) ×2 IMPLANT
RETRACTOR WND ALEXIS 25 LRG (MISCELLANEOUS) ×1 IMPLANT
RTRCTR C-SECT PINK 25CM LRG (MISCELLANEOUS) IMPLANT
RTRCTR WOUND ALEXIS 25CM LRG (MISCELLANEOUS) ×2
SLEEVE SCD COMPRESS KNEE MED (MISCELLANEOUS) ×2 IMPLANT
STRIP CLOSURE SKIN 1/2X4 (GAUZE/BANDAGES/DRESSINGS) IMPLANT
SUT CHROMIC 0 CTX 36 (SUTURE) ×4 IMPLANT
SUT VIC AB 0 CT1 27 (SUTURE) ×1
SUT VIC AB 0 CT1 27XBRD ANBCTR (SUTURE) ×1 IMPLANT
SUT VIC AB 2-0 CT1 27 (SUTURE) ×2
SUT VIC AB 2-0 CT1 TAPERPNT 27 (SUTURE) ×2 IMPLANT
SUT VIC AB 4-0 KS 27 (SUTURE) ×2 IMPLANT
SYR BULB IRRIGATION 50ML (SYRINGE) IMPLANT
TRAY FOLEY CATH 14FR (SET/KITS/TRAYS/PACK) ×2 IMPLANT
WATER STERILE IRR 1000ML POUR (IV SOLUTION) ×2 IMPLANT

## 2011-12-13 NOTE — Op Note (Signed)
See the details of the operative note included in the brief of note

## 2011-12-13 NOTE — Interval H&P Note (Signed)
History and Physical Interval Note:  12/13/2011 2:41 PM  Victoria Terrell  has presented today for surgery, with the diagnosis of previous cesarean section desires sterilization  The various methods of treatment have been discussed with the patient and family. After consideration of risks, benefits and other options for treatment, the patient has consented to  Procedure(s) (LRB): CESAREAN SECTION WITH BILATERAL TUBAL LIGATION (Bilateral) as a surgical intervention .  The patients' history has been reviewed, patient examined, no change in status, stable for surgery.  I have reviewed the patients' chart and labs.  Questions were answered to the patient's satisfaction.  We will remove the old cicatrix and some adjacent skin to achieve a smoother scar.   [name]

## 2011-12-13 NOTE — Anesthesia Procedure Notes (Signed)
Spinal  Patient location during procedure: OR Preanesthetic Checklist Completed: patient identified, site marked, surgical consent, pre-op evaluation, timeout performed, IV checked, risks and benefits discussed and monitors and equipment checked Spinal Block Patient position: sitting Prep: DuraPrep Patient monitoring: heart rate, cardiac monitor, continuous pulse ox and blood pressure Approach: midline Location: L3-4 Injection technique: single-shot Needle Needle type: Sprotte  Needle gauge: 24 G Needle length: 9 cm Assessment Sensory level: T4 Additional Notes Spinal Dosage in OR  Bupivicaine ml       1.7 PFMS04   mcg        150 Fentanyl mcg            25    

## 2011-12-13 NOTE — Consult Note (Signed)
Neonatology Note:   Attendance at C-section:    I was asked to attend this repeat C/S at term. The mother is a G3P2 A pos, GBS pos with asthma. ROM at delivery, fluid clear. Infant vigorous with good spontaneous cry and tone. Needed only minimal bulb suctioning. Ap 8/9 Lungs clear to ausc in DR. To CN to care of Pediatrician.   Deatra James, MD

## 2011-12-13 NOTE — Anesthesia Preprocedure Evaluation (Addendum)
Anesthesia Evaluation  Patient identified by MRN, date of birth, ID band Patient awake    Reviewed: Allergy & Precautions, H&P , Patient's Chart, lab work & pertinent test results  Airway Mallampati: II TM Distance: >3 FB Neck ROM: full    Dental No notable dental hx.    Pulmonary asthma (presently wheezing, pt not aware. + smoker as well) ,    + wheezing      Cardiovascular Exercise Tolerance: Good Rhythm:regular Rate:Normal     Neuro/Psych Anxiety  Neuromuscular disease (Pt has bulging disc and  chronic back pain on Narcotics for this)    GI/Hepatic GERD-  ,  Endo/Other    Renal/GU      Musculoskeletal   Abdominal   Peds  Hematology   Anesthesia Other Findings   Reproductive/Obstetrics                          Anesthesia Physical Anesthesia Plan  ASA: II  Anesthesia Plan: Spinal   Post-op Pain Management:    Induction:   Airway Management Planned:   Additional Equipment:   Intra-op Plan:   Post-operative Plan:   Informed Consent: I have reviewed the patients History and Physical, chart, labs and discussed the procedure including the risks, benefits and alternatives for the proposed anesthesia with the patient or authorized representative who has indicated his/her understanding and acceptance.   Dental Advisory Given  Plan Discussed with: CRNA  Anesthesia Plan Comments: (Lab work confirmed with CRNA in room. Platelets okay. Discussed spinal anesthetic, and patient consents to the procedure:  included risk of possible headache,backache, failed block, allergic reaction, and nerve injury. This patient was asked if she had any questions or concerns before the procedure started. )        Anesthesia Quick Evaluation

## 2011-12-13 NOTE — Transfer of Care (Signed)
Immediate Anesthesia Transfer of Care Note  Patient: Victoria Terrell  Procedure(s) Performed: Procedure(s) (LRB): CESAREAN SECTION WITH BILATERAL TUBAL LIGATION (Bilateral)  Patient Location: PACU  Anesthesia Type: Spinal  Level of Consciousness: awake, alert  and oriented  Airway & Oxygen Therapy: Patient Spontanous Breathing  Post-op Assessment: Report given to PACU RN and Post -op Vital signs reviewed and stable  Post vital signs: Reviewed and stable  Complications: No apparent anesthesia complications

## 2011-12-13 NOTE — Brief Op Note (Signed)
12/13/2011  4:41 PM  PATIENT:  Victoria Terrell  31 y.o. female  PRE-OPERATIVE DIAGNOSIS:  previous cesarean section desires sterilization pregnancy 39 weeks  POST-OPERATIVE DIAGNOSIS:  previous cesarean section desires sterilization same, delivered  PROCEDURE:  Procedure(s) (LRB): Repeat low transverse CESAREAN SECTION WITH BILATERAL TUBAL LIGATION (Bilateral)  SURGEON:  Surgeon(s) and Role:    * [name], MD - Primary [place] Mink.O. PHYSICIAN ASSISTANT:   ASSISTANTS: none   ANESTHESIA:   spinal  EBL:  Total I/O In: 3000 [I.V.:3000] Out: 800 [Urine:100; Blood:700]  BLOOD ADMINISTERED:none  DRAINS: Urinary Catheter (Foley)   LOCAL MEDICATIONS USED:  NONE  SPECIMEN:  Source of Specimen:  Placenta to labor and delivery  DISPOSITION OF SPECIMEN:  Labor and delivery  COUNTS:  YES  TOURNIQUET:  * No tourniquets in log *  DICTATION: .Dragon Dictation Patient was taken to the operating room prepped and draped after spinal anesthesia a Foley catheter in place. Timeout was conducted the surgical procedure confirmed by surgical team. A transverse incision was made excising the old scar and 1 cm skin on either side to allow fresh tissue edges. The remainder of the opening was standard Pfannenstiel technique. Peritoneum was opened easily, and Alexis wound retractor positioned fetal vertex was delivered thorough transverse uterine incision and cord clamped. Nuchal cord x2 was encountered at delivery. The center delivered intact Victoria Terrell presentation. Uterus was clean./ Inspection of the uterine fundus showed a bulging area in the midline which appeared to be soft, suggestive of a developing fibroid. This measured about 5-7 cm uterine closure was single-layer running locking 0 chromic followed by running 0 chromic. Tubal ligation: Tubal ligation was performed using Filshie clips after identifying the tube to its fimbriated end. Peritoneum was irrigated and closed using running  2-0 Vicryl. Fascia was closed using running 0 Vicryl, and subcutaneous fatty tissues reapproximated with 3 interrupted sutures of 2-0 Vicryl followed by subcuticular 4-0 Vicryl closure the skin dressings were applied and patient went to recovery in good condition estimated blood loss 700 cc  PLAN OF CARE: Admit to inpatient   PATIENT DISPOSITION:  PACU - hemodynamically stable.   Delay start of Pharmacological VTE agent (>24hrs) due to surgical blood loss or risk of bleeding: Not applicable

## 2011-12-14 LAB — CBC
HCT: 26.4 % — ABNORMAL LOW (ref 36.0–46.0)
Hemoglobin: 8.7 g/dL — ABNORMAL LOW (ref 12.0–15.0)
MCHC: 33 g/dL (ref 30.0–36.0)
WBC: 8.3 10*3/uL (ref 4.0–10.5)

## 2011-12-14 NOTE — Progress Notes (Addendum)
Subjective: Postpartum Day 1: Cesarean Delivery Patient reports incisional pain.   Eating well. No N/V. No flatus or stool yet. Still has catheter in.  Objective: Vital signs in last 24 hours: Temp:  [97.6 F (36.4 C)-98.6 F (37 C)] 98.1 F (36.7 C) (03/29 0359) Pulse Rate:  [60-98] 68  (03/29 0359) Resp:  [14-21] 14  (03/29 0600) BP: (86-118)/(39-72) 86/40 mmHg (03/29 0359) SpO2:  [96 %-100 %] 96 % (03/29 0600) Weight:  [97.523 kg (215 lb)] 97.523 kg (215 lb) (03/28 1900)  Physical Exam:  General: alert, cooperative, appears stated age and no distress Lochia: appropriate Uterine Fundus: firm Incision: healing well, no significant drainage DVT Evaluation: No evidence of DVT seen on physical exam. No significant calf/ankle edema.   Basename 12/14/11 0516 12/11/11 0913  HGB 8.7* 10.3*  HCT 26.4* 30.9*    Assessment/Plan: Status post Cesarean section. Doing well postoperatively.  Continue current care. Breastfeeding. Difficult latch. Will order lactation consult. Contraception: BTL  Ala Dach 12/14/2011, 6:45 AM   I have seen this patient and agree with the above resident's note.  LEFTWICH-KIRBY, Needham Biggins Certified Nurse-Midwife

## 2011-12-14 NOTE — Anesthesia Postprocedure Evaluation (Signed)
Anesthesia Post Note  Patient: Victoria Terrell  Procedure(s) Performed: Procedure(s) (LRB): CESAREAN SECTION WITH BILATERAL TUBAL LIGATION (Bilateral)  Anesthesia type: SAB  Patient location: Mother/Baby  Post pain: Pain level controlled  Post assessment: Post-op Vital signs reviewed  Last Vitals:  Filed Vitals:   12/14/11 0600  BP:   Pulse:   Temp:   Resp: 14    Post vital signs: Reviewed  Level of consciousness: awake  Complications: No apparent anesthesia complications

## 2011-12-14 NOTE — Progress Notes (Signed)
Clinical Social Work Department PSYCHOSOCIAL ASSESSMENT - MATERNAL/CHILD 12/14/2011  Patient:  Victoria Terrell, Victoria Terrell  Account Number:  [id number]  Admit Date:  12/13/2011  Marjo Bicker Name:   Ardell Isaacs Poindexter    Clinical Social Worker:  Andy Gauss   Date/Time:  12/14/2011 01:00 PM  Date Referred:  12/14/2011   Referral source  CN     Referred reason  Substance Abuse   Other referral source:    I:  FAMILY / HOME ENVIRONMENT Child's legal guardian:  PARENT  Guardian - Name Guardian - Age Guardian - Address  [address]; [place], [place] [address]  Ellsworth Lennox 37    Other household support members/support persons Other support:    II  PSYCHOSOCIAL DATA Information Source:  Patient Interview  Event organiser Employment:   Surveyor, quantity resources:  [employer] If Medicaid - County:  [place]  School / Grade:   Maternity Care Coordinator / Child Services Coordination / Early Interventions:  Cultural issues impacting care:    III  STRENGTHS Strengths  Adequate Resources  Supportive family/friends  Home prepared for Child (including basic supplies)   Strength comment:    IV  RISK FACTORS AND CURRENT PROBLEMS Current Problem:  YES   Risk Factor & Current Problem Patient Issue Family Issue Risk Factor / Current Problem Comment  Substance Abuse Y N History Marijuana, Benzo's & Opiate use    V  SOCIAL WORK ASSESSMENT Sw referral received to assess history of MJ, opiate and benzo use.  Pt admits to smoking MJ years ago but states she stopped smoking after the birth of her son in 2011.  Pt states she relapsed during the pregnancy, as she admitted to smoking "only one time."  She explained that she was stressed out at the time and the MJ helped with panic attacks.  She denies regular use and is confident that the drug screen results will be negative after Sw explained hospital drug testing policy.  UDS is negative, meconium  results are pending.  She has a prescription (Dr. Jorene Guest) for Xanax of which she takes, daily or PRN.  Pt also admits to taking Vicodin during the pregnancy for back pain.  According to the pt, Dr. Emelda Fear prescribed the opiates, in addition to her dentist (Oct.12), and ER physicians, "one time" during the pregnancy.  She reports taking the pain pills "a couple times a week," during the pregnancy.  Pt does not have custody of her daughter, as she lives with her father.  Pt's son is not in her custody at this time, as he lives with her mother & sister. According to pt, CPS was involved and made a kinship plan with her mother.  It was alleged that pt was abusing drugs and sleeping all the time, as per pt.  CPS case was closed 07/10/11, as per pt.  Due to the holiday, [facility] CPS is closed.  After hours/holiday protocol is to call 911.  This Sw called [facility] [phone]) to in an attempt to reach an on-call CPS worker to report pt's history (loss custody of children).  Sw left contact information with 911 operator to pass to CPS worker. Sw will continue to follow and report to CPS when worker calls.          VI SOCIAL WORK PLAN Social Work Plan  Child Management consultant Report   Type of pt/family education:   If child protective services report - county:  [place]  If child  protective services report - date:  12/14/2011 Information/referral to community resources comment:   Other social work plan:

## 2011-12-14 NOTE — Progress Notes (Signed)
UR Chart review completed.  

## 2011-12-15 NOTE — Progress Notes (Signed)
Social Work Note - Received call from SW, Tedra Slade, this a.m.  She spoke with [place] CPS worker, Jennifer Watkins, last evening, 3/29.  Per CPS, there is not a case.  They will follow up on Monday.  Baby can d/c with mother if medically ready.  Please contact SW if questions. Tyrrell Kruschke, LCSW  

## 2011-12-15 NOTE — Progress Notes (Addendum)
Post-Operative Day #2 Subjective: Patient doing well. States she continues to have pain but is ambulating with no difficulty. Currently on Motrin and Percocet for pain. Patient had BTL. Desires outpatient circ at [facility].  Objective: Blood pressure 101/63, pulse 65, temperature 97.3 F (36.3 C), temperature source Oral, resp. rate 18, weight 97.523 kg (215 lb), last menstrual period 03/07/2011, SpO2 98.00%, not currently breastfeeding.  Physical Exam:  General: alert, cooperative and no distress Lochia: appropriate Uterine Fundus: firm Incision: healing well, no significant drainage, no dehiscence, no significant erythema DVT Evaluation: No evidence of DVT seen on physical exam.   Basename 12/14/11 0516  HGB 8.7*  HCT 26.4*    Assessment/Plan: Discharge home and Contraception BTL Bottle feeding. Outpatient circumcision    LOS: 2 days   HAIRFORD, AMBER 12/15/2011, 8:32 AM   Patient seen and examined.  Agree with above note.   Candelaria Celeste JEHIEL 12/15/2011 9:32 AM

## 2011-12-16 MED ORDER — IBUPROFEN 600 MG PO TABS: 600.0000 mg | ORAL_TABLET | Freq: Four times a day (QID) | ORAL | Status: AC

## 2011-12-16 MED ORDER — OXYCODONE-ACETAMINOPHEN 5-325 MG PO TABS: 1.0000 | ORAL_TABLET | ORAL | Status: AC | PRN

## 2011-12-16 NOTE — Discharge Summary (Signed)
Obstetric Discharge Summary Reason for Admission: cesarean section and BTL Prenatal Procedures: NST and ultrasound Intrapartum Procedures: cesarean: low cervical, transverse and BTL Postpartum Procedures: none Complications-Operative and Postpartum: none Hemoglobin  Date Value Range Status  12/14/2011 8.7* 12.0-15.0 (g/dL) Final     HCT  Date Value Range Status  12/14/2011 26.4* 36.0-46.0 (%) Final    Physical Exam:  General: alert, cooperative and no distress Lochia: appropriate Uterine Fundus: firm Incision: healing well, no significant drainage, no dehiscence, no significant erythema DVT Evaluation: No evidence of DVT seen on physical exam.  Discharge Diagnoses: Term Pregnancy-delivered  Discharge Information: Date: 12/16/2011 Activity: pelvic rest Diet: routine Medications: Ibuprofen and Percocet Condition: stable Instructions: refer to practice specific booklet Discharge to: home Follow-up Information    Follow up with [facility]. (as scheduled)          Newborn Data: Live born female  Birth Weight: 6 lb 12.1 oz (3065 g) APGAR: 8, 9  Home with mother.  Bottle feeding. Plans for outpatient circ at [facility]. Will follow up at [facility] in 4-6 weeks. Continues to have pain, but controlled at discharge with Motrin and Percocet. Given #30 of both of these.  Hani Campusano 12/16/2011, 7:23 AM

## 2011-12-16 NOTE — Discharge Summary (Signed)
Agree with note. 

## 2011-12-28 ENCOUNTER — Emergency Department (HOSPITAL_COMMUNITY)
Admission: EM | Admit: 2011-12-28 | Discharge: 2011-12-28 | Disposition: A | Discharge status: Home / Self Care | Payer: Medicaid Other | Attending: Emergency Medicine | Admitting: Emergency Medicine

## 2011-12-28 ENCOUNTER — Encounter (HOSPITAL_COMMUNITY): Payer: Self-pay | Admitting: Emergency Medicine

## 2011-12-28 DIAGNOSIS — J45909 Unspecified asthma, uncomplicated: Secondary | ICD-10-CM | POA: Insufficient documentation

## 2011-12-28 DIAGNOSIS — G8929 Other chronic pain: Secondary | ICD-10-CM | POA: Insufficient documentation

## 2011-12-28 DIAGNOSIS — K0889 Other specified disorders of teeth and supporting structures: Secondary | ICD-10-CM

## 2011-12-28 DIAGNOSIS — K219 Gastro-esophageal reflux disease without esophagitis: Secondary | ICD-10-CM | POA: Insufficient documentation

## 2011-12-28 DIAGNOSIS — F341 Dysthymic disorder: Secondary | ICD-10-CM | POA: Insufficient documentation

## 2011-12-28 DIAGNOSIS — R220 Localized swelling, mass and lump, head: Secondary | ICD-10-CM | POA: Insufficient documentation

## 2011-12-28 DIAGNOSIS — K089 Disorder of teeth and supporting structures, unspecified: Secondary | ICD-10-CM | POA: Insufficient documentation

## 2011-12-28 MED ORDER — HYDROCODONE-ACETAMINOPHEN 5-325 MG PO TABS
1.0000 | ORAL_TABLET | Freq: Once | ORAL | Status: AC
  Administered 2011-12-28: 1 via ORAL
  Filled 2011-12-28: qty 1

## 2011-12-28 MED ORDER — PENICILLIN V POTASSIUM 500 MG PO TABS: 500.0000 mg | ORAL_TABLET | Freq: Four times a day (QID) | ORAL | Status: AC

## 2011-12-28 MED ORDER — IBUPROFEN 800 MG PO TABS
800.0000 mg | ORAL_TABLET | Freq: Once | ORAL | Status: AC
  Administered 2011-12-28: 800 mg via ORAL
  Filled 2011-12-28: qty 1

## 2011-12-28 MED ORDER — HYDROCODONE-ACETAMINOPHEN 5-325 MG PO TABS: 1.0000 | ORAL_TABLET | Freq: Four times a day (QID) | ORAL | Status: AC | PRN

## 2011-12-28 MED ORDER — PENICILLIN V POTASSIUM 250 MG PO TABS
500.0000 mg | ORAL_TABLET | Freq: Once | ORAL | Status: AC
  Administered 2011-12-28: 500 mg via ORAL
  Filled 2011-12-28: qty 2

## 2011-12-28 NOTE — ED Notes (Signed)
Pt presents with left lower tooth pain. Pt states started "hurting yesterday". Poor dental hygiene noted. No swelling noted

## 2011-12-28 NOTE — Discharge Instructions (Signed)
Dental Pain A tooth ache may be caused by cavities (tooth decay). Cavities expose the nerve of the tooth to air and hot or cold temperatures. It may come from an infection or abscess (also called a boil or furuncle) around your tooth. It is also often caused by dental caries (tooth decay). This causes the pain you are having. DIAGNOSIS  Your caregiver can diagnose this problem by exam. TREATMENT   If caused by an infection, it may be treated with medications which kill germs (antibiotics) and pain medications as prescribed by your caregiver. Take medications as directed.   Only take over-the-counter or prescription medicines for pain, discomfort, or fever as directed by your caregiver.   Whether the tooth ache today is caused by infection or dental disease, you should see your dentist as soon as possible for further care.  SEEK MEDICAL CARE IF: The exam and treatment you received today has been provided on an emergency basis only. This is not a substitute for complete medical or dental care. If your problem worsens or new problems (symptoms) appear, and you are unable to meet with your dentist, call or return to this location. SEEK IMMEDIATE MEDICAL CARE IF:   You have a fever.   You develop redness and swelling of your face, jaw, or neck.   You are unable to open your mouth.   You have severe pain uncontrolled by pain medicine.  MAKE SURE YOU:   Understand these instructions.   Will watch your condition.   Will get help right away if you are not doing well or get worse.  Document Released: 09/03/2005 Document Revised: 08/23/2011 Document Reviewed: 04/21/2008 [facility] Patient Information 2012 [place], [place].   Take the meds as directed.  Take ibuprofen 800 mg every 8 hrs with food.  Follow up with your dentist or another if they can see you sooner.
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
## 2011-12-28 NOTE — ED Notes (Signed)
Pt c/o toothache to left lower jaw since yesterday.

## 2011-12-29 NOTE — ED Provider Notes (Signed)
Medical screening examination/treatment/procedure(s) were performed by non-physician practitioner and as supervising physician I was immediately available for consultation/collaboration.   Shelda Jakes, MD 12/29/11 [phone]

## 2012-01-06 ENCOUNTER — Emergency Department (HOSPITAL_COMMUNITY)
Admission: EM | Admit: 2012-01-06 | Discharge: 2012-01-06 | Disposition: A | Discharge status: Home / Self Care | Payer: Medicaid Other | Attending: Emergency Medicine | Admitting: Emergency Medicine

## 2012-01-06 ENCOUNTER — Emergency Department (HOSPITAL_COMMUNITY): Payer: Medicaid Other

## 2012-01-06 ENCOUNTER — Encounter (HOSPITAL_COMMUNITY): Payer: Self-pay

## 2012-01-06 DIAGNOSIS — J45909 Unspecified asthma, uncomplicated: Secondary | ICD-10-CM | POA: Insufficient documentation

## 2012-01-06 DIAGNOSIS — K219 Gastro-esophageal reflux disease without esophagitis: Secondary | ICD-10-CM | POA: Insufficient documentation

## 2012-01-06 DIAGNOSIS — F341 Dysthymic disorder: Secondary | ICD-10-CM | POA: Insufficient documentation

## 2012-01-06 DIAGNOSIS — F17200 Nicotine dependence, unspecified, uncomplicated: Secondary | ICD-10-CM | POA: Insufficient documentation

## 2012-01-06 DIAGNOSIS — R10819 Abdominal tenderness, unspecified site: Secondary | ICD-10-CM | POA: Insufficient documentation

## 2012-01-06 DIAGNOSIS — R109 Unspecified abdominal pain: Secondary | ICD-10-CM | POA: Insufficient documentation

## 2012-01-06 LAB — COMPREHENSIVE METABOLIC PANEL
AST: 11 U/L (ref 0–37)
BUN: 12 mg/dL (ref 6–23)
CO2: 26 mEq/L (ref 19–32)
Calcium: 9.7 mg/dL (ref 8.4–10.5)
Creatinine, Ser: 0.82 mg/dL (ref 0.50–1.10)
GFR calc Af Amer: >90 mL/min (ref >90)
GFR calc non Af Amer: >90 mL/min (ref >90)
Glucose, Bld: 89 mg/dL (ref 70–99)

## 2012-01-06 LAB — DIFFERENTIAL
Basophils Absolute: 0.1 10*3/uL (ref 0.0–0.1)
Eosinophils Relative: 2 % (ref 0–5)
Lymphocytes Relative: 46 % (ref 12–46)
Monocytes Absolute: 0.3 10*3/uL (ref 0.1–1.0)
Monocytes Relative: 4 % (ref 3–12)

## 2012-01-06 LAB — CBC
HCT: 38.2 % (ref 36.0–46.0)
MCV: 88.6 fL (ref 78.0–100.0)
RDW: 13.4 % (ref 11.5–15.5)
WBC: 6.5 10*3/uL (ref 4.0–10.5)

## 2012-01-06 LAB — URINALYSIS, ROUTINE W REFLEX MICROSCOPIC
Glucose, UA: NEGATIVE mg/dL
Hgb urine dipstick: NEGATIVE
Ketones, ur: NEGATIVE mg/dL
Protein, ur: NEGATIVE mg/dL

## 2012-01-06 MED ORDER — ONDANSETRON HCL 4 MG/2ML IJ SOLN
4.0000 mg | Freq: Once | INTRAMUSCULAR | Status: AC
  Administered 2012-01-06: 4 mg via INTRAVENOUS
  Filled 2012-01-06: qty 2

## 2012-01-06 MED ORDER — SODIUM CHLORIDE 0.9 % IV BOLUS (SEPSIS)
1000.0000 mL | Freq: Once | INTRAVENOUS | Status: AC
  Administered 2012-01-06: 1000 mL via INTRAVENOUS

## 2012-01-06 MED ORDER — HYDROMORPHONE HCL PF 1 MG/ML IJ SOLN
1.0000 mg | Freq: Once | INTRAMUSCULAR | Status: AC
  Administered 2012-01-06: 1 mg via INTRAVENOUS
  Filled 2012-01-06: qty 1

## 2012-01-06 MED ORDER — OXYCODONE-ACETAMINOPHEN 5-325 MG PO TABS: 2.0000 | ORAL_TABLET | ORAL | Status: DC | PRN

## 2012-01-06 MED ORDER — ONDANSETRON HCL 8 MG PO TABS: 8.0000 mg | ORAL_TABLET | ORAL | Status: AC | PRN

## 2012-01-06 NOTE — ED Notes (Signed)
MD at bedside. 

## 2012-01-06 NOTE — ED Notes (Signed)
Pt DC to home with steady gait 
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
## 2012-01-06 NOTE — Discharge Instructions (Signed)
Medications for pain and nausea. Followup Dr. Emelda Fear tomorrow. Return for fever, chills, vaginal bleeding or discharge

## 2012-01-06 NOTE — ED Notes (Signed)
Pt reports had c section March 28th.  Reports pain had stopped after a couple of weeks.  Reports Friday started having abd pain around incision.  Pt says feels a "knot" under skin  on left side of incision.   Denies any n/v.   Denies any pain or burning with urination.  Reports vaginal bleeding had stopped and doesn't have any vaginal discharge.

## 2012-01-14 ENCOUNTER — Encounter (HOSPITAL_COMMUNITY): Payer: Self-pay | Admitting: *Deleted

## 2012-01-14 ENCOUNTER — Emergency Department (HOSPITAL_COMMUNITY)
Admission: EM | Admit: 2012-01-14 | Discharge: 2012-01-14 | Disposition: A | Discharge status: Home / Self Care | Payer: Medicaid Other | Attending: Emergency Medicine | Admitting: Emergency Medicine

## 2012-01-14 DIAGNOSIS — F3289 Other specified depressive episodes: Secondary | ICD-10-CM | POA: Insufficient documentation

## 2012-01-14 DIAGNOSIS — F411 Generalized anxiety disorder: Secondary | ICD-10-CM | POA: Insufficient documentation

## 2012-01-14 DIAGNOSIS — M5416 Radiculopathy, lumbar region: Secondary | ICD-10-CM

## 2012-01-14 DIAGNOSIS — F329 Major depressive disorder, single episode, unspecified: Secondary | ICD-10-CM | POA: Insufficient documentation

## 2012-01-14 DIAGNOSIS — F17200 Nicotine dependence, unspecified, uncomplicated: Secondary | ICD-10-CM | POA: Insufficient documentation

## 2012-01-14 DIAGNOSIS — J45909 Unspecified asthma, uncomplicated: Secondary | ICD-10-CM | POA: Insufficient documentation

## 2012-01-14 DIAGNOSIS — M545 Low back pain, unspecified: Secondary | ICD-10-CM | POA: Insufficient documentation

## 2012-01-14 DIAGNOSIS — IMO0002 Reserved for concepts with insufficient information to code with codable children: Secondary | ICD-10-CM | POA: Insufficient documentation

## 2012-01-14 MED ORDER — PREDNISONE 10 MG PO TABS: ORAL_TABLET | ORAL | Status: DC

## 2012-01-14 MED ORDER — DEXAMETHASONE SODIUM PHOSPHATE 4 MG/ML IJ SOLN
10.0000 mg | Freq: Once | INTRAMUSCULAR | Status: AC
  Administered 2012-01-14: 10 mg via INTRAMUSCULAR
  Filled 2012-01-14: qty 3

## 2012-01-14 MED ORDER — OXYCODONE-ACETAMINOPHEN 5-325 MG PO TABS: 1.0000 | ORAL_TABLET | ORAL | Status: AC | PRN

## 2012-01-14 MED ORDER — HYDROMORPHONE HCL PF 1 MG/ML IJ SOLN
1.0000 mg | Freq: Once | INTRAMUSCULAR | Status: AC
  Administered 2012-01-14: 1 mg via INTRAMUSCULAR
  Filled 2012-01-14: qty 1

## 2012-01-14 NOTE — Discharge Instructions (Signed)

## 2012-01-14 NOTE — ED Notes (Signed)
Sent from chiropractor's office with x-rays

## 2012-01-14 NOTE — ED Notes (Signed)
Discharge instructions reviewed with pt, questions answered. Pt verbalized understanding.  

## 2012-01-14 NOTE — ED Notes (Signed)
Lt side low back pain for 4 years.
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
## 2012-01-19 NOTE — ED Provider Notes (Signed)
Medical screening examination/treatment/procedure(s) were performed by non-physician practitioner and as supervising physician I was immediately available for consultation/collaboration.  Notnamed Scholz S. Tranika Scholler, MD 01/19/12 2322 

## 2012-02-05 ENCOUNTER — Emergency Department (HOSPITAL_COMMUNITY)
Admission: EM | Admit: 2012-02-05 | Discharge: 2012-02-05 | Disposition: A | Discharge status: Home / Self Care | Payer: Self-pay | Attending: Emergency Medicine | Admitting: Emergency Medicine

## 2012-02-05 ENCOUNTER — Encounter (HOSPITAL_COMMUNITY): Payer: Self-pay

## 2012-02-05 DIAGNOSIS — M5430 Sciatica, unspecified side: Secondary | ICD-10-CM | POA: Insufficient documentation

## 2012-02-05 DIAGNOSIS — M545 Low back pain, unspecified: Secondary | ICD-10-CM | POA: Insufficient documentation

## 2012-02-05 DIAGNOSIS — R209 Unspecified disturbances of skin sensation: Secondary | ICD-10-CM | POA: Insufficient documentation

## 2012-02-05 DIAGNOSIS — Z79899 Other long term (current) drug therapy: Secondary | ICD-10-CM | POA: Insufficient documentation

## 2012-02-05 DIAGNOSIS — J45909 Unspecified asthma, uncomplicated: Secondary | ICD-10-CM | POA: Insufficient documentation

## 2012-02-05 DIAGNOSIS — K219 Gastro-esophageal reflux disease without esophagitis: Secondary | ICD-10-CM | POA: Insufficient documentation

## 2012-02-05 DIAGNOSIS — F17200 Nicotine dependence, unspecified, uncomplicated: Secondary | ICD-10-CM | POA: Insufficient documentation

## 2012-02-05 DIAGNOSIS — F341 Dysthymic disorder: Secondary | ICD-10-CM | POA: Insufficient documentation

## 2012-02-05 LAB — URINALYSIS, ROUTINE W REFLEX MICROSCOPIC
Leukocytes, UA: NEGATIVE
Nitrite: NEGATIVE
Specific Gravity, Urine: 1.025 (ref 1.005–1.030)
Urobilinogen, UA: 0.2 mg/dL (ref 0.0–1.0)

## 2012-02-05 LAB — PREGNANCY, URINE: Preg Test, Ur: NEGATIVE

## 2012-02-05 LAB — URINE MICROSCOPIC-ADD ON

## 2012-02-05 MED ORDER — IBUPROFEN 800 MG PO TABS
800.0000 mg | ORAL_TABLET | Freq: Once | ORAL | Status: AC
  Administered 2012-02-05: 800 mg via ORAL
  Filled 2012-02-05: qty 1

## 2012-02-05 MED ORDER — METHYLPREDNISOLONE 4 MG PO KIT: PACK | ORAL | Status: AC

## 2012-02-05 MED ORDER — IBUPROFEN 800 MG PO TABS: 800.0000 mg | ORAL_TABLET | Freq: Three times a day (TID) | ORAL | Status: AC

## 2012-02-05 MED ORDER — OXYCODONE-ACETAMINOPHEN 5-325 MG PO TABS
2.0000 | ORAL_TABLET | Freq: Once | ORAL | Status: AC
  Administered 2012-02-05: 2 via ORAL
  Filled 2012-02-05: qty 2

## 2012-02-05 MED ORDER — OXYCODONE-ACETAMINOPHEN 5-325 MG PO TABS: 2.0000 | ORAL_TABLET | ORAL | Status: AC | PRN

## 2012-02-05 MED ORDER — PREDNISONE 20 MG PO TABS
60.0000 mg | ORAL_TABLET | Freq: Once | ORAL | Status: AC
  Administered 2012-02-05: 60 mg via ORAL
  Filled 2012-02-05: qty 3
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
## 2012-02-05 NOTE — ED Notes (Signed)
Low back pain, onset this am after changing a diaper.  Alert, Pain lt hip area.

## 2012-02-05 NOTE — ED Notes (Signed)
Pt reports that she began having low back pain today, denies any injury.

## 2012-02-05 NOTE — Discharge Instructions (Signed)

## 2012-02-20 ENCOUNTER — Encounter (HOSPITAL_COMMUNITY): Payer: Self-pay | Admitting: Oncology

## 2012-02-20 ENCOUNTER — Emergency Department (HOSPITAL_COMMUNITY)
Admission: EM | Admit: 2012-02-20 | Discharge: 2012-02-20 | Disposition: A | Discharge status: Home / Self Care | Payer: Medicaid Other | Attending: Emergency Medicine | Admitting: Emergency Medicine

## 2012-02-20 DIAGNOSIS — G8929 Other chronic pain: Secondary | ICD-10-CM | POA: Insufficient documentation

## 2012-02-20 DIAGNOSIS — M545 Low back pain, unspecified: Secondary | ICD-10-CM | POA: Insufficient documentation

## 2012-02-20 DIAGNOSIS — K219 Gastro-esophageal reflux disease without esophagitis: Secondary | ICD-10-CM | POA: Insufficient documentation

## 2012-02-20 DIAGNOSIS — F17200 Nicotine dependence, unspecified, uncomplicated: Secondary | ICD-10-CM | POA: Insufficient documentation

## 2012-02-20 MED ORDER — HYDROCODONE-ACETAMINOPHEN 5-325 MG PO TABS: 1.0000 | ORAL_TABLET | Freq: Four times a day (QID) | ORAL | Status: AC | PRN

## 2012-02-20 MED ORDER — HYDROCODONE-ACETAMINOPHEN 5-325 MG PO TABS
1.0000 | ORAL_TABLET | Freq: Once | ORAL | Status: AC
  Administered 2012-02-20: 1 via ORAL
  Filled 2012-02-20: qty 1

## 2012-02-20 MED ORDER — IBUPROFEN 800 MG PO TABS
800.0000 mg | ORAL_TABLET | Freq: Once | ORAL | Status: AC
  Administered 2012-02-20: 800 mg via ORAL
  Filled 2012-02-20: qty 1
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
## 2012-02-20 NOTE — ED Notes (Signed)
Pt called , says she needs med for pain, tearful.  R Miller notified.

## 2012-02-20 NOTE — ED Notes (Addendum)
Victoria Terrell reports back pain x this week with falls because her back is "giving out" - reports h/o buldging disc.  Patient also states that she is going next week to [facility] for epidural injections.

## 2012-02-20 NOTE — Discharge Instructions (Signed)
Cryotherapy Cryotherapy means treatment with cold. Ice or gel packs can be used to reduce both pain and swelling. Ice is the most helpful within the first 24 to 48 hours after an injury or flareup from overusing a muscle or joint. Sprains, strains, spasms, burning pain, shooting pain, and aches can all be eased with ice. Ice can also be used when recovering from surgery. Ice is effective, has very few side effects, and is safe for most people to use. PRECAUTIONS  Ice is not a safe treatment option for people with:  Raynaud's phenomenon. This is a condition affecting small blood vessels in the extremities. Exposure to cold may cause your problems to return.   Cold hypersensitivity. There are many forms of cold hypersensitivity, including:   Cold urticaria. Red, itchy hives appear on the skin when the tissues begin to warm after being iced.   Cold erythema. This is a red, itchy rash caused by exposure to cold.   Cold hemoglobinuria. Red blood cells break down when the tissues begin to warm after being iced. The hemoglobin that carry oxygen are passed into the urine because they cannot combine with blood proteins fast enough.   Numbness or altered sensitivity in the area being iced.  If you have any of the following conditions, do not use ice until you have discussed cryotherapy with your caregiver:  Heart conditions, such as arrhythmia, angina, or chronic heart disease.   High blood pressure.   Healing wounds or open skin in the area being iced.   Current infections.   Rheumatoid arthritis.   Poor circulation.   Diabetes.  Ice slows the blood flow in the region it is applied. This is beneficial when trying to stop inflamed tissues from spreading irritating chemicals to surrounding tissues. However, if you expose your skin to cold temperatures for too long or without the proper protection, you can damage your skin or nerves. Watch for signs of skin damage due to cold. HOME CARE  INSTRUCTIONS Follow these tips to use ice and cold packs safely.  Place a dry or damp towel between the ice and skin. A damp towel will cool the skin more quickly, so you may need to shorten the time that the ice is used.   For a more rapid response, add gentle compression to the ice.   Ice for no more than 10 to 20 minutes at a time. The bonier the area you are icing, the less time it will take to get the benefits of ice.   Check your skin after 5 minutes to make sure there are no signs of a poor response to cold or skin damage.   Rest 20 minutes or more in between uses.   Once your skin is numb, you can end your treatment. You can test numbness by very lightly touching your skin. The touch should be so light that you do not see the skin dimple from the pressure of your fingertip. When using ice, most people will feel these normal sensations in this order: cold, burning, aching, and numbness.   Do not use ice on someone who cannot communicate their responses to pain, such as small children or people with dementia.  HOW TO MAKE AN ICE PACK Ice packs are the most common way to use ice therapy. Other methods include ice massage, ice baths, and cryo-sprays. Muscle creams that cause a cold, tingly feeling do not offer the same benefits that ice offers and should not be used as a substitute  unless recommended by your caregiver. To make an ice pack, do one of the following:  Place crushed ice or a bag of frozen vegetables in a sealable plastic bag. Squeeze out the excess air. Place this bag inside another plastic bag. Slide the bag into a pillowcase or place a damp towel between your skin and the bag.   Mix 3 parts water with 1 part rubbing alcohol. Freeze the mixture in a sealable plastic bag. When you remove the mixture from the freezer, it will be slushy. Squeeze out the excess air. Place this bag inside another plastic bag. Slide the bag into a pillowcase or place a damp towel between your skin  and the bag.  SEEK MEDICAL CARE IF:  You develop white spots on your skin. This may give the skin a blotchy (mottled) appearance.   Your skin turns blue or pale.   Your skin becomes waxy or hard.   Your swelling gets worse.  MAKE SURE YOU:   Understand these instructions.   Will watch your condition.   Will get help right away if you are not doing well or get worse.  Document Released: 04/30/2011 Document Revised: 08/23/2011 Document Reviewed: 04/30/2011 [facility] Patient Information 2012 [place], [place].Back Pain, Adult Low back pain is very common. About 1 in 5 people have back pain.The cause of low back pain is rarely dangerous. The pain often gets better over time.About half of people with a sudden onset of back pain feel better in just 2 weeks. About 8 in 10 people feel better by 6 weeks.  CAUSES Some common causes of back pain include:  Strain of the muscles or ligaments supporting the spine.   Wear and tear (degeneration) of the spinal discs.   Arthritis.   Direct injury to the back.  DIAGNOSIS Most of the time, the direct cause of low back pain is not known.However, back pain can be treated effectively even when the exact cause of the pain is unknown.Answering your caregiver's questions about your overall health and symptoms is one of the most accurate ways to make sure the cause of your pain is not dangerous. If your caregiver needs more information, he or she may order lab work or imaging tests (X-rays or MRIs).However, even if imaging tests show changes in your back, this usually does not require surgery. HOME CARE INSTRUCTIONS For many people, back pain returns.Since low back pain is rarely dangerous, it is often a condition that people can learn to [facility] their own.   Remain active. It is stressful on the back to sit or stand in one place. Do not sit, drive, or stand in one place for more than 30 minutes at a time. Take short walks on level surfaces as soon as  pain allows.Try to increase the length of time you walk each day.   Do not stay in bed.Resting more than 1 or 2 days can delay your recovery.   Do not avoid exercise or work.Your body is made to move.It is not dangerous to be active, even though your back may hurt.Your back will likely heal faster if you return to being active before your pain is gone.   Pay attention to your body when you bend and lift. Many people have less discomfortwhen lifting if they bend their knees, keep the load close to their bodies,and avoid twisting. Often, the most comfortable positions are those that put less stress on your recovering back.   Find a comfortable position to sleep. Use a firm  mattress and lie on your side with your knees slightly bent. If you lie on your back, put a pillow under your knees.   Only take over-the-counter or prescription medicines as directed by your caregiver. Over-the-counter medicines to reduce pain and inflammation are often the most helpful.Your caregiver may prescribe muscle relaxant drugs.These medicines help dull your pain so you can more quickly return to your normal activities and healthy exercise.   Put ice on the injured area.   Put ice in a plastic bag.   Place a towel between your skin and the bag.   Leave the ice on for 15 to 20 minutes, 3 to 4 times a day for the first 2 to 3 days. After that, ice and heat may be alternated to reduce pain and spasms.   Ask your caregiver about trying back exercises and gentle massage. This may be of some benefit.   Avoid feeling anxious or stressed.Stress increases muscle tension and can worsen back pain.It is important to recognize when you are anxious or stressed and learn ways to manage it.Exercise is a great option.  SEEK MEDICAL CARE IF:  You have pain that is not relieved with rest or medicine.   You have pain that does not improve in 1 week.   You have new symptoms.   You are generally not feeling well.    SEEK IMMEDIATE MEDICAL CARE IF:   You have pain that radiates from your back into your legs.   You develop new bowel or bladder control problems.   You have unusual weakness or numbness in your arms or legs.   You develop nausea or vomiting.   You develop abdominal pain.   You feel faint.  Document Released: 09/03/2005 Document Revised: 08/23/2011 Document Reviewed: 01/22/2011 [facility] Patient Information 2012 [place], [place].   Take the pain medicine as directed.  If you need more, call dr. Charlett Blake.

## 2012-02-20 NOTE — ED Provider Notes (Signed)
Medical screening examination/treatment/procedure(s) were performed by non-physician practitioner and as supervising physician I was immediately available for consultation/collaboration.  Jemiah Ellenburg, MD 02/20/12 1547 
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
## 2012-03-01 ENCOUNTER — Emergency Department (HOSPITAL_COMMUNITY)
Admission: EM | Admit: 2012-03-01 | Discharge: 2012-03-01 | Disposition: A | Discharge status: Home / Self Care | Payer: Medicaid Other | Attending: Emergency Medicine | Admitting: Emergency Medicine

## 2012-03-01 ENCOUNTER — Encounter (HOSPITAL_COMMUNITY): Payer: Self-pay | Admitting: *Deleted

## 2012-03-01 DIAGNOSIS — G8929 Other chronic pain: Secondary | ICD-10-CM | POA: Insufficient documentation

## 2012-03-01 DIAGNOSIS — K0889 Other specified disorders of teeth and supporting structures: Secondary | ICD-10-CM

## 2012-03-01 DIAGNOSIS — F17200 Nicotine dependence, unspecified, uncomplicated: Secondary | ICD-10-CM | POA: Insufficient documentation

## 2012-03-01 DIAGNOSIS — K029 Dental caries, unspecified: Secondary | ICD-10-CM | POA: Insufficient documentation

## 2012-03-01 DIAGNOSIS — K219 Gastro-esophageal reflux disease without esophagitis: Secondary | ICD-10-CM | POA: Insufficient documentation

## 2012-03-01 MED ORDER — HYDROCODONE-ACETAMINOPHEN 5-325 MG PO TABS: ORAL_TABLET | ORAL | Status: DC

## 2012-03-01 MED ORDER — PENICILLIN V POTASSIUM 250 MG PO TABS: 250.0000 mg | ORAL_TABLET | Freq: Four times a day (QID) | ORAL | Status: AC

## 2012-03-01 NOTE — ED Notes (Signed)
Dental pain left upper jaw 
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
## 2012-03-01 NOTE — Discharge Instructions (Signed)
RESOURCE GUIDE  Chronic Pain Problems: Contact [facility]  [phone] Patients need to be referred by their primary care doctor.  Insufficient Money for Medicine: Contact United Way:  call "211" or [facility] [phone].  No Primary Care Doctor: - Call [facility]  [phone] - can help you locate a primary care doctor that  accepts your insurance, provides certain services, etc. - Physician Referral Service- 1-800-533-3463  Agencies that provide inexpensive medical care: - [facility]  [phone] - [facility]  [phone] - [facility]  [phone] - Women's Clinic  [phone] - Planned Parenthood  [phone] - [facility]  [phone]  Medicaid-accepting [place] Providers: - [facility]- [address]  [phone], Mon-Fri 9am-7pm, Sat 9am-1pm - [facility]- [address], [address]  [phone] - [facility]- [address], [address]  [phone] - [facility]- [address] [place] [address]  [phone] - Veita Bland- [address], [phone]  Only accepts [place] Access Medicaid patients after they have their name  applied to their card  Self Pay (no insurance) in [place]: - Sickle Cell Patients: Victoria Terrell, [facility]  [address], [phone] - [facility]- [address]  [phone]       -     [facility] [place]- [address]       -     [facility]- see information above (Speak to Pam H if you do not have insurance)       -  [facility]- [address], [phone]       -  [facility] [place]- [address],  [phone]       -  [facility]- [address] [place] [address], [phone]       -  [facility]-  [address], [address], [place], [phone]       -  [facility]- [address], [phone]       -  [facility]- [address] [place] [address], [phone], also [address], [phone]       -    [facility]- [address], [phone], 1st & 3rd Saturday   every month, 10am-1pm  1) Find a Doctor and Pay Out of Pocket Although you won't have to find out who is covered by your insurance plan, it is a good idea to ask around and get recommendations. You will then need to call the office and see if the doctor you have chosen will accept you as a new patient and what types of options they offer for patients who are self-pay. Some doctors offer discounts or will set up payment plans for their patients who do not have insurance, but you will need to ask so you aren't surprised when you get to your appointment.  2) Contact Your Local Health Department Not all health departments have doctors that can see patients for sick visits, but many do, so it is worth a call to see if yours does. If you don't know where your local health department is, you can check in your phone book. The CDC also has a tool to help you locate your state's health department, and many state websites also have   listings of all of their local health departments.  3) Find a Walk-in Clinic If your illness is not likely to be very severe or complicated, you may want to try a walk in clinic. These are popping up all over the country in pharmacies, drugstores, and shopping centers. They're usually staffed by nurse practitioners or physician assistants that have been trained to treat common illnesses and complaints. They're usually fairly quick and inexpensive. However, if you have serious medical issues or chronic medical problems, these are probably not your best option  STD Testing - [place] [facility] [place], [facility] Clinic, [address], [place], phone [phone] or 1-877-539-9860.  Monday - Friday, call for an appointment. - [place]  [facility] [place], [facility]D Clinic, [address], [place], phone [phone] or 1-877-539-9860.  Monday - Friday, call for an appointment.  Abuse/Neglect: - [place] Child Abuse Hotline (336) 641-3795 - [place] Child Abuse Hotline 800-378-5315 (After Hours)  Emergency Shelter:  [facility] (336) 271-5985  Maternity Homes: - [facility] (336) 275-9566 - [facility] (704) 372-4663  MRSA Hotline #:   832-7006  [place] Resources  [facility] [place]  [employer] [place] [facility] [address]                 [address]         [address]  [place]                                               [place]                              [place] Phone:  [phone]                                  Phone:  [phone]                   Phone:  [phone]  [place] [facility], [phone] - [place] Services - [facility]- 1-888-581-9988       -     [facility] in [place], [address],                                  336-349-4454, Insurance  [place] Child Abuse Hotline (336) 342-1394 or (336) 342-3537 (After Hours)   Behavioral Health Services  Substance Abuse Resources: - [facility]  336-882-2125 - [facility] 336-784-9470 - [facility] 336-285-9073 - [facility] 336-845-3988 - Residential & Outpatient Substance Abuse Program  800-659-3381  Psychological Services: -  Health  [phone] - [facility]  [phone] - [place] [facility], [address], [place], ACCESS LINE: 1-800-853-5163 or 336-641-4981, Http://www.[facility].com/services/adult.htm  Dental Assistance  If unable to pay or uninsured, contact:  [facility] or [place] [facility] to become qualified for the adult dental  clinic.  Patients with Medicaid:  [facility] [address], [phone], [phone]  If unable   to pay, or uninsured, contact [facility] 587-104-6728) or [facility] Department [phone] in [place], [phone] in [facility]) to become qualified for the adult dental clinic  Other Low-Cost Community Dental Services: - [facility]- [address] [place], [place], [place], [address], [phone], [address], 2nd and 4th Thursday of the month at 6:30am.  10 clients each day by appointment, can sometimes see walk-in patients if someone does not show for an appointment. [facility]- [address] [place], [place], [address], [phone] - [facility] [place] [facility]- [address], [place], [place], [address], [phone] [facility] Health Department- [phone] [facility]- [phone] [facility] [phone]     Take the prescriptions as directed.  Call your regular dentist on Monday to confirm your previously scheduled appointment for this week.  Return to the Emergency Department immediately sooner if worsening.

## 2012-03-01 NOTE — ED Notes (Signed)
Pt c/o toothache, Dr. Clarene Duke in prior to RN, see EDP assessment for further.

## 2012-03-06 ENCOUNTER — Emergency Department (HOSPITAL_COMMUNITY): Payer: Medicaid Other

## 2012-03-06 ENCOUNTER — Emergency Department (HOSPITAL_COMMUNITY)
Admission: EM | Admit: 2012-03-06 | Discharge: 2012-03-06 | Disposition: A | Discharge status: Home / Self Care | Payer: Medicaid Other | Attending: Emergency Medicine | Admitting: Emergency Medicine

## 2012-03-06 ENCOUNTER — Encounter (HOSPITAL_COMMUNITY): Payer: Self-pay

## 2012-03-06 DIAGNOSIS — R109 Unspecified abdominal pain: Secondary | ICD-10-CM | POA: Insufficient documentation

## 2012-03-06 DIAGNOSIS — F411 Generalized anxiety disorder: Secondary | ICD-10-CM | POA: Insufficient documentation

## 2012-03-06 DIAGNOSIS — G8929 Other chronic pain: Secondary | ICD-10-CM | POA: Insufficient documentation

## 2012-03-06 DIAGNOSIS — M549 Dorsalgia, unspecified: Secondary | ICD-10-CM | POA: Insufficient documentation

## 2012-03-06 DIAGNOSIS — F329 Major depressive disorder, single episode, unspecified: Secondary | ICD-10-CM | POA: Insufficient documentation

## 2012-03-06 DIAGNOSIS — F17200 Nicotine dependence, unspecified, uncomplicated: Secondary | ICD-10-CM | POA: Insufficient documentation

## 2012-03-06 DIAGNOSIS — R1110 Vomiting, unspecified: Secondary | ICD-10-CM | POA: Insufficient documentation

## 2012-03-06 DIAGNOSIS — N10 Acute tubulo-interstitial nephritis: Secondary | ICD-10-CM | POA: Insufficient documentation

## 2012-03-06 DIAGNOSIS — K219 Gastro-esophageal reflux disease without esophagitis: Secondary | ICD-10-CM | POA: Insufficient documentation

## 2012-03-06 DIAGNOSIS — F3289 Other specified depressive episodes: Secondary | ICD-10-CM | POA: Insufficient documentation

## 2012-03-06 LAB — URINALYSIS, ROUTINE W REFLEX MICROSCOPIC
Bilirubin Urine: NEGATIVE
Glucose, UA: NEGATIVE mg/dL
Specific Gravity, Urine: 1.025 (ref 1.005–1.030)
pH: 5.5 (ref 5.0–8.0)

## 2012-03-06 LAB — POCT PREGNANCY, URINE: Preg Test, Ur: NEGATIVE

## 2012-03-06 LAB — URINE MICROSCOPIC-ADD ON

## 2012-03-06 MED ORDER — NAPROXEN 500 MG PO TABS: 500.0000 mg | ORAL_TABLET | Freq: Two times a day (BID) | ORAL | Status: DC

## 2012-03-06 MED ORDER — HYDROMORPHONE HCL PF 1 MG/ML IJ SOLN
1.0000 mg | Freq: Once | INTRAMUSCULAR | Status: AC
  Administered 2012-03-06: 1 mg via INTRAVENOUS
  Filled 2012-03-06: qty 1

## 2012-03-06 MED ORDER — OXYCODONE-ACETAMINOPHEN 5-325 MG PO TABS: 1.0000 | ORAL_TABLET | Freq: Four times a day (QID) | ORAL | Status: AC | PRN

## 2012-03-06 MED ORDER — DEXTROSE 5 % IV SOLN
1.0000 g | Freq: Once | INTRAVENOUS | Status: AC
  Administered 2012-03-06: 1 g via INTRAVENOUS
  Filled 2012-03-06: qty 10

## 2012-03-06 MED ORDER — SODIUM CHLORIDE 0.9 % IV BOLUS (SEPSIS)
1000.0000 mL | Freq: Once | INTRAVENOUS | Status: AC
  Administered 2012-03-06: 1000 mL via INTRAVENOUS

## 2012-03-06 MED ORDER — CEPHALEXIN 500 MG PO CAPS: 500.0000 mg | ORAL_CAPSULE | Freq: Four times a day (QID) | ORAL | Status: AC

## 2012-03-06 MED ORDER — PROMETHAZINE HCL 25 MG PO TABS: 25.0000 mg | ORAL_TABLET | Freq: Four times a day (QID) | ORAL | Status: DC | PRN

## 2012-03-06 MED ORDER — KETOROLAC TROMETHAMINE 30 MG/ML IJ SOLN
30.0000 mg | Freq: Once | INTRAMUSCULAR | Status: AC
  Administered 2012-03-06: 30 mg via INTRAVENOUS
  Filled 2012-03-06: qty 1

## 2012-03-06 MED ORDER — ONDANSETRON HCL 4 MG/2ML IJ SOLN
4.0000 mg | Freq: Once | INTRAMUSCULAR | Status: AC
  Administered 2012-03-06: 4 mg via INTRAVENOUS
  Filled 2012-03-06: qty 2
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
## 2012-03-06 NOTE — ED Notes (Signed)
Pt reports that she thinks she may have a kidney stone, right flank pain, vomited x1 yesterday, having urinary urgency. Denies any vaginal discharge.

## 2012-03-06 NOTE — Discharge Instructions (Signed)
Pyelonephritis, Adult Pyelonephritis is a kidney infection. In general, there are 2 main types of pyelonephritis:  Infections that come on quickly without any warning (acute pyelonephritis).   Infections that persist for a long period of time (chronic pyelonephritis).  CAUSES  Two main causes of pyelonephritis are:  Bacteria traveling from the bladder to the kidney. This is a problem especially in pregnant women. The urine in the bladder can become filled with bacteria from multiple causes, including:   Inflammation of the prostate gland (prostatitis).   Sexual intercourse in females.   Bladder infection (cystitis).   Bacteria traveling from the bloodstream to the tissue part of the kidney.  Problems that may increase your risk of getting a kidney infection include:  Diabetes.   Kidney stones or bladder stones.   Cancer.   Catheters placed in the bladder.   Other abnormalities of the kidney or ureter.  SYMPTOMS   Abdominal pain.   Pain in the side or flank area.   Fever.   Chills.   Upset stomach.   Blood in the urine (dark urine).   Frequent urination.   Strong or persistent urge to urinate.   Burning or stinging when urinating.  DIAGNOSIS  Your caregiver may diagnose your kidney infection based on your symptoms. A urine sample may also be taken. TREATMENT  In general, treatment depends on how severe the infection is.   If the infection is mild and caught early, your caregiver may treat you with oral antibiotics and send you home.   If the infection is more severe, the bacteria may have gotten into the bloodstream. This will require intravenous (IV) antibiotics and a hospital stay. Symptoms may include:   High fever.   Severe flank pain.   Shaking chills.   Even after a hospital stay, your caregiver may require you to be on oral antibiotics for a period of time.   Other treatments may be required depending upon the cause of the infection.  HOME CARE  INSTRUCTIONS   Take your antibiotics as directed. Finish them even if you start to feel better.   Make an appointment to have your urine checked to make sure the infection is gone.   Drink enough fluids to keep your urine clear or pale yellow.   Take medicines for the bladder if you have urgency and frequency of urination as directed by your caregiver.  SEEK IMMEDIATE MEDICAL CARE IF:   You have a fever.   You are unable to take your antibiotics or fluids.   You develop shaking chills.   You experience extreme weakness or fainting.   There is no improvement after 2 days of treatment.  MAKE SURE YOU:  Understand these instructions.   Will watch your condition.   Will get help right away if you are not doing well or get worse.  Document Released: 09/03/2005 Document Revised: 08/23/2011 Document Reviewed: 02/07/2011 [employer] Patient Information 2012 [employer]. 

## 2012-04-21 ENCOUNTER — Emergency Department (HOSPITAL_COMMUNITY)
Admission: EM | Admit: 2012-04-21 | Discharge: 2012-04-21 | Disposition: A | Discharge status: Home / Self Care | Payer: Medicaid Other | Attending: Emergency Medicine | Admitting: Emergency Medicine

## 2012-04-21 ENCOUNTER — Encounter (HOSPITAL_COMMUNITY): Payer: Self-pay | Admitting: *Deleted

## 2012-04-21 DIAGNOSIS — F17200 Nicotine dependence, unspecified, uncomplicated: Secondary | ICD-10-CM | POA: Insufficient documentation

## 2012-04-21 DIAGNOSIS — K219 Gastro-esophageal reflux disease without esophagitis: Secondary | ICD-10-CM | POA: Insufficient documentation

## 2012-04-21 DIAGNOSIS — J45909 Unspecified asthma, uncomplicated: Secondary | ICD-10-CM | POA: Insufficient documentation

## 2012-04-21 DIAGNOSIS — G8929 Other chronic pain: Secondary | ICD-10-CM | POA: Insufficient documentation

## 2012-04-21 DIAGNOSIS — F3289 Other specified depressive episodes: Secondary | ICD-10-CM | POA: Insufficient documentation

## 2012-04-21 DIAGNOSIS — M549 Dorsalgia, unspecified: Secondary | ICD-10-CM

## 2012-04-21 DIAGNOSIS — F410 Panic disorder [episodic paroxysmal anxiety] without agoraphobia: Secondary | ICD-10-CM | POA: Insufficient documentation

## 2012-04-21 DIAGNOSIS — F329 Major depressive disorder, single episode, unspecified: Secondary | ICD-10-CM | POA: Insufficient documentation

## 2012-04-21 DIAGNOSIS — N390 Urinary tract infection, site not specified: Secondary | ICD-10-CM | POA: Insufficient documentation

## 2012-04-21 LAB — URINALYSIS, ROUTINE W REFLEX MICROSCOPIC
Bilirubin Urine: NEGATIVE
Glucose, UA: NEGATIVE mg/dL
Ketones, ur: NEGATIVE mg/dL
Leukocytes, UA: NEGATIVE
Nitrite: POSITIVE — AB
Protein, ur: NEGATIVE mg/dL

## 2012-04-21 LAB — URINE MICROSCOPIC-ADD ON

## 2012-04-21 MED ORDER — HYDROCODONE-ACETAMINOPHEN 5-325 MG PO TABS: ORAL_TABLET | ORAL | Status: AC

## 2012-04-21 MED ORDER — CEPHALEXIN 500 MG PO CAPS
500.0000 mg | ORAL_CAPSULE | Freq: Once | ORAL | Status: AC
  Administered 2012-04-21: 500 mg via ORAL
  Filled 2012-04-21: qty 1

## 2012-04-21 MED ORDER — CEPHALEXIN 250 MG PO CAPS: 250.0000 mg | ORAL_CAPSULE | Freq: Four times a day (QID) | ORAL | Status: AC

## 2012-04-21 NOTE — ED Notes (Signed)
Low back pain, no known injury .  Has an infant to care for.

## 2012-04-21 NOTE — ED Notes (Signed)
Pt reports having low back pain for several days, denies any known injury
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
## 2012-04-23 LAB — URINE CULTURE

## 2012-04-24 NOTE — ED Notes (Signed)
+   urine  Patient treated appropriately -sensitive to same-chart appended per protocol MD.  

## 2012-04-25 NOTE — ED Provider Notes (Signed)
Medical screening examination/treatment/procedure(s) were performed by non-physician practitioner and as supervising physician I was immediately available for consultation/collaboration.  Flint Melter, MD 04/25/12 [phone]

## 2012-06-20 ENCOUNTER — Encounter (HOSPITAL_COMMUNITY): Payer: Self-pay | Admitting: Emergency Medicine

## 2012-06-20 ENCOUNTER — Emergency Department (HOSPITAL_COMMUNITY): Payer: Medicaid Other

## 2012-06-20 ENCOUNTER — Emergency Department (HOSPITAL_COMMUNITY)
Admission: EM | Admit: 2012-06-20 | Discharge: 2012-06-20 | Disposition: A | Discharge status: Home / Self Care | Payer: Medicaid Other | Attending: Emergency Medicine | Admitting: Emergency Medicine

## 2012-06-20 DIAGNOSIS — S9031XA Contusion of right foot, initial encounter: Secondary | ICD-10-CM

## 2012-06-20 DIAGNOSIS — G8929 Other chronic pain: Secondary | ICD-10-CM | POA: Insufficient documentation

## 2012-06-20 DIAGNOSIS — F329 Major depressive disorder, single episode, unspecified: Secondary | ICD-10-CM | POA: Insufficient documentation

## 2012-06-20 DIAGNOSIS — Z888 Allergy status to other drugs, medicaments and biological substances status: Secondary | ICD-10-CM | POA: Insufficient documentation

## 2012-06-20 DIAGNOSIS — K219 Gastro-esophageal reflux disease without esophagitis: Secondary | ICD-10-CM | POA: Insufficient documentation

## 2012-06-20 DIAGNOSIS — M549 Dorsalgia, unspecified: Secondary | ICD-10-CM

## 2012-06-20 DIAGNOSIS — S9030XA Contusion of unspecified foot, initial encounter: Secondary | ICD-10-CM | POA: Insufficient documentation

## 2012-06-20 DIAGNOSIS — F411 Generalized anxiety disorder: Secondary | ICD-10-CM | POA: Insufficient documentation

## 2012-06-20 DIAGNOSIS — Z885 Allergy status to narcotic agent status: Secondary | ICD-10-CM | POA: Insufficient documentation

## 2012-06-20 DIAGNOSIS — J45909 Unspecified asthma, uncomplicated: Secondary | ICD-10-CM | POA: Insufficient documentation

## 2012-06-20 DIAGNOSIS — W208XXA Other cause of strike by thrown, projected or falling object, initial encounter: Secondary | ICD-10-CM | POA: Insufficient documentation

## 2012-06-20 DIAGNOSIS — F17200 Nicotine dependence, unspecified, uncomplicated: Secondary | ICD-10-CM | POA: Insufficient documentation

## 2012-06-20 DIAGNOSIS — F3289 Other specified depressive episodes: Secondary | ICD-10-CM | POA: Insufficient documentation

## 2012-06-20 MED ORDER — OXYCODONE-ACETAMINOPHEN 5-325 MG PO TABS: 1.0000 | ORAL_TABLET | Freq: Four times a day (QID) | ORAL | Status: DC | PRN

## 2012-06-20 MED ORDER — OXYCODONE-ACETAMINOPHEN 5-325 MG PO TABS
2.0000 | ORAL_TABLET | Freq: Once | ORAL | Status: AC
  Administered 2012-06-20: 2 via ORAL
  Filled 2012-06-20: qty 2

## 2012-06-20 MED ORDER — PREDNISONE 50 MG PO TABS: 50.0000 mg | ORAL_TABLET | Freq: Every day | ORAL | Status: DC

## 2012-06-20 NOTE — ED Notes (Signed)
Patient states a shelf fell on her right foot; c/o right foot pain.  Also states that she jumped back and jarred her lower back when shelf landed on her.
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
## 2012-06-30 ENCOUNTER — Emergency Department (HOSPITAL_COMMUNITY)
Admission: EM | Admit: 2012-06-30 | Discharge: 2012-06-30 | Disposition: A | Discharge status: Home / Self Care | Payer: Medicaid Other | Attending: Emergency Medicine | Admitting: Emergency Medicine

## 2012-06-30 ENCOUNTER — Emergency Department (HOSPITAL_COMMUNITY): Payer: Medicaid Other

## 2012-06-30 ENCOUNTER — Encounter (HOSPITAL_COMMUNITY): Payer: Self-pay | Admitting: *Deleted

## 2012-06-30 DIAGNOSIS — F3289 Other specified depressive episodes: Secondary | ICD-10-CM | POA: Insufficient documentation

## 2012-06-30 DIAGNOSIS — K219 Gastro-esophageal reflux disease without esophagitis: Secondary | ICD-10-CM | POA: Insufficient documentation

## 2012-06-30 DIAGNOSIS — S9031XA Contusion of right foot, initial encounter: Secondary | ICD-10-CM

## 2012-06-30 DIAGNOSIS — M549 Dorsalgia, unspecified: Secondary | ICD-10-CM | POA: Insufficient documentation

## 2012-06-30 DIAGNOSIS — X58XXXA Exposure to other specified factors, initial encounter: Secondary | ICD-10-CM | POA: Insufficient documentation

## 2012-06-30 DIAGNOSIS — Z885 Allergy status to narcotic agent status: Secondary | ICD-10-CM | POA: Insufficient documentation

## 2012-06-30 DIAGNOSIS — Z888 Allergy status to other drugs, medicaments and biological substances status: Secondary | ICD-10-CM | POA: Insufficient documentation

## 2012-06-30 DIAGNOSIS — S9030XA Contusion of unspecified foot, initial encounter: Secondary | ICD-10-CM | POA: Insufficient documentation

## 2012-06-30 DIAGNOSIS — F411 Generalized anxiety disorder: Secondary | ICD-10-CM | POA: Insufficient documentation

## 2012-06-30 DIAGNOSIS — G8929 Other chronic pain: Secondary | ICD-10-CM | POA: Insufficient documentation

## 2012-06-30 DIAGNOSIS — F17200 Nicotine dependence, unspecified, uncomplicated: Secondary | ICD-10-CM | POA: Insufficient documentation

## 2012-06-30 DIAGNOSIS — J45909 Unspecified asthma, uncomplicated: Secondary | ICD-10-CM | POA: Insufficient documentation

## 2012-06-30 DIAGNOSIS — F329 Major depressive disorder, single episode, unspecified: Secondary | ICD-10-CM | POA: Insufficient documentation

## 2012-06-30 MED ORDER — HYDROCODONE-ACETAMINOPHEN 5-325 MG PO TABS
1.0000 | ORAL_TABLET | Freq: Once | ORAL | Status: AC
  Administered 2012-06-30: 1 via ORAL
  Filled 2012-06-30: qty 1

## 2012-06-30 MED ORDER — HYDROCODONE-ACETAMINOPHEN 5-325 MG PO TABS: 1.0000 | ORAL_TABLET | Freq: Four times a day (QID) | ORAL | Status: AC | PRN

## 2012-06-30 NOTE — ED Notes (Signed)
Pt has hx of shelf falling on her left foot on 06/20/2012, states that her foot had gotten better but when she stood up this am pain was shooting through her foot, denies any new injury
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
## 2012-07-01 NOTE — ED Provider Notes (Signed)
Medical screening examination/treatment/procedure(s) were performed by non-physician practitioner and as supervising physician I was immediately available for consultation/collaboration.   Magdalena Skilton L Porcia Morganti, MD 07/01/12 1625 

## 2012-08-18 ENCOUNTER — Emergency Department (HOSPITAL_COMMUNITY)
Admission: EM | Admit: 2012-08-18 | Discharge: 2012-08-18 | Disposition: A | Discharge status: Home / Self Care | Payer: Medicaid Other | Attending: Emergency Medicine | Admitting: Emergency Medicine

## 2012-08-18 ENCOUNTER — Encounter (HOSPITAL_COMMUNITY): Payer: Self-pay | Admitting: *Deleted

## 2012-08-18 DIAGNOSIS — F1210 Cannabis abuse, uncomplicated: Secondary | ICD-10-CM | POA: Insufficient documentation

## 2012-08-18 DIAGNOSIS — Z8719 Personal history of other diseases of the digestive system: Secondary | ICD-10-CM | POA: Insufficient documentation

## 2012-08-18 DIAGNOSIS — Z8669 Personal history of other diseases of the nervous system and sense organs: Secondary | ICD-10-CM | POA: Insufficient documentation

## 2012-08-18 DIAGNOSIS — F410 Panic disorder [episodic paroxysmal anxiety] without agoraphobia: Secondary | ICD-10-CM | POA: Insufficient documentation

## 2012-08-18 DIAGNOSIS — Z79899 Other long term (current) drug therapy: Secondary | ICD-10-CM | POA: Insufficient documentation

## 2012-08-18 DIAGNOSIS — F17200 Nicotine dependence, unspecified, uncomplicated: Secondary | ICD-10-CM | POA: Insufficient documentation

## 2012-08-18 DIAGNOSIS — F1110 Opioid abuse, uncomplicated: Secondary | ICD-10-CM | POA: Insufficient documentation

## 2012-08-18 DIAGNOSIS — K089 Disorder of teeth and supporting structures, unspecified: Secondary | ICD-10-CM | POA: Insufficient documentation

## 2012-08-18 DIAGNOSIS — K0889 Other specified disorders of teeth and supporting structures: Secondary | ICD-10-CM

## 2012-08-18 MED ORDER — HYDROCODONE-ACETAMINOPHEN 5-325 MG PO TABS: ORAL_TABLET | ORAL | Status: DC

## 2012-08-18 MED ORDER — AMOXICILLIN 500 MG PO CAPS: 500.0000 mg | ORAL_CAPSULE | Freq: Three times a day (TID) | ORAL | Status: DC

## 2012-08-18 NOTE — ED Notes (Signed)
Dental pain since 11/26 when 2 teeth broke.
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
## 2012-08-22 NOTE — ED Provider Notes (Signed)
Medical screening examination/treatment/procedure(s) were performed by non-physician practitioner and as supervising physician I was immediately available for consultation/collaboration.  Pearce Littlefield, MD 08/22/12 0618 

## 2012-09-18 IMAGING — CR DG ABDOMEN ACUTE W/ 1V CHEST
3 series · 3 of 3 positions shown · non-contrast
Comparison: CT abdomen pelvis 08/02/2006.

CLINICAL DATA: Abdominal and pelvic pain.

ACUTE ABDOMEN SERIES (ABDOMEN 2 VIEW & CHEST 1 VIEW)

[view not recorded (1 of 3)]
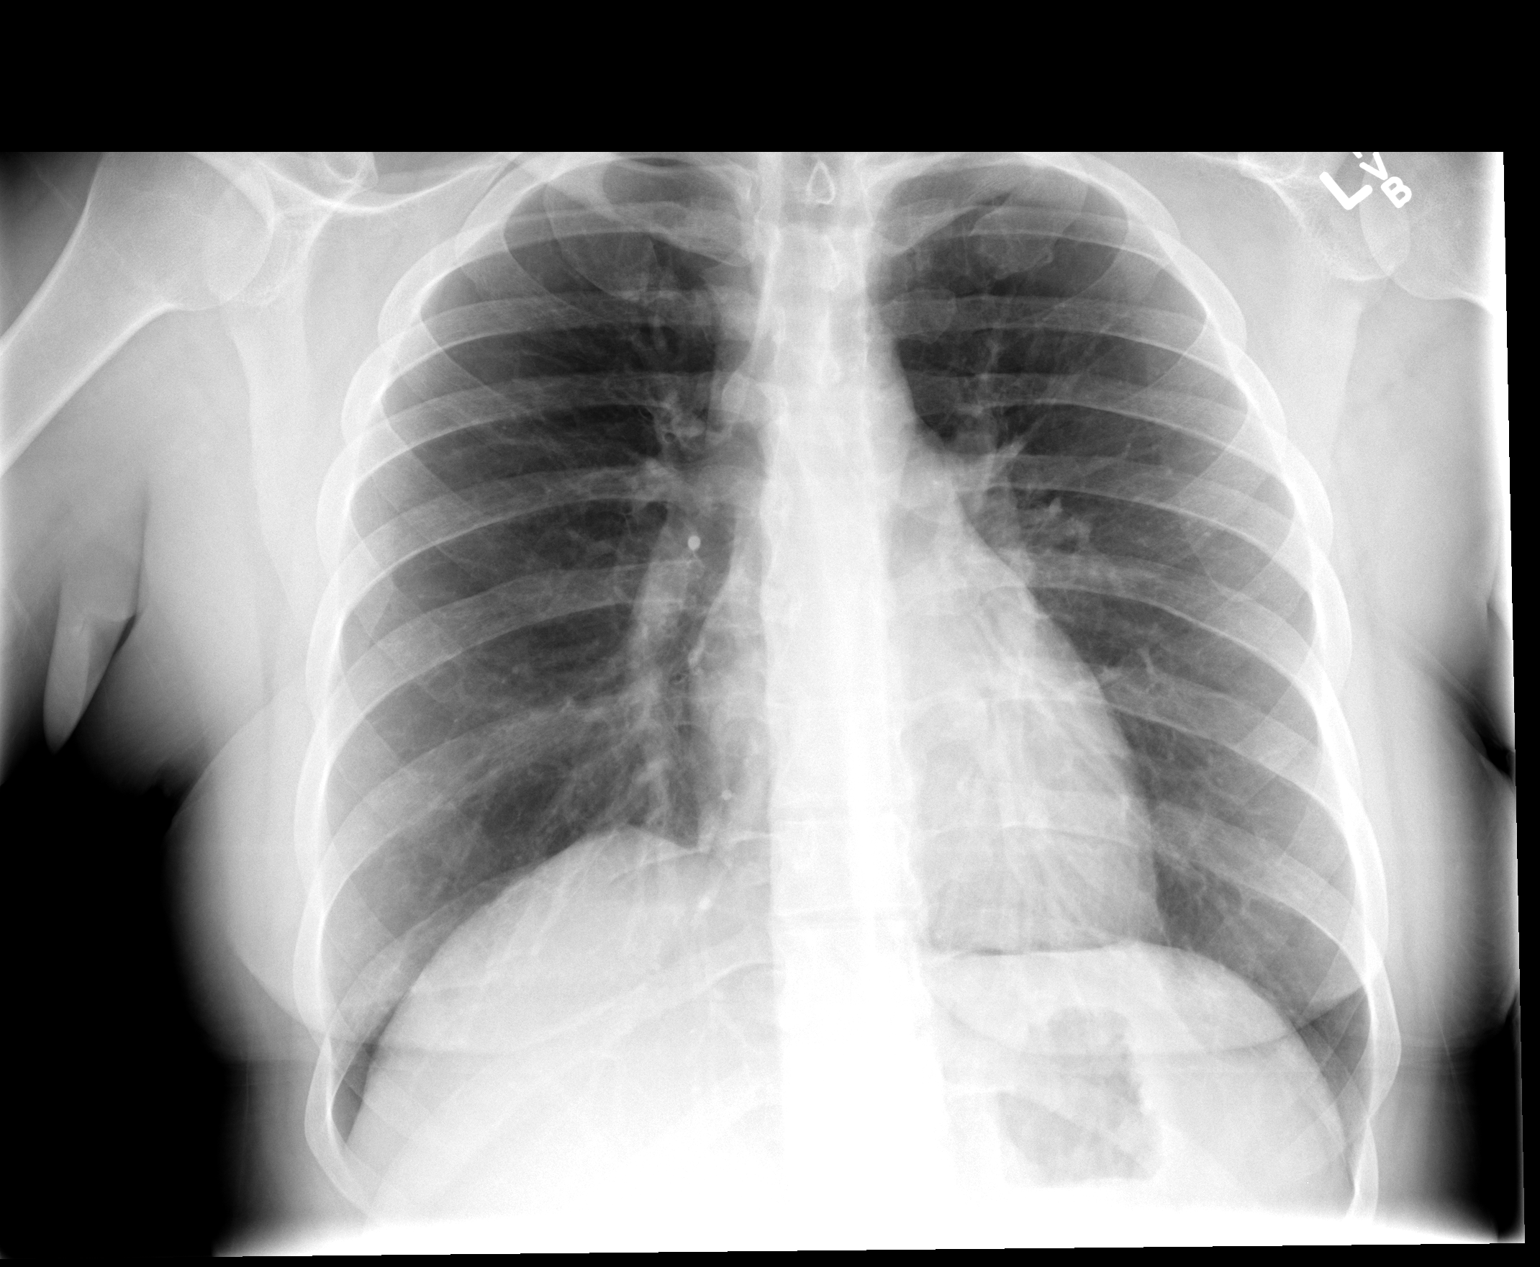

[view not recorded (2 of 3)]
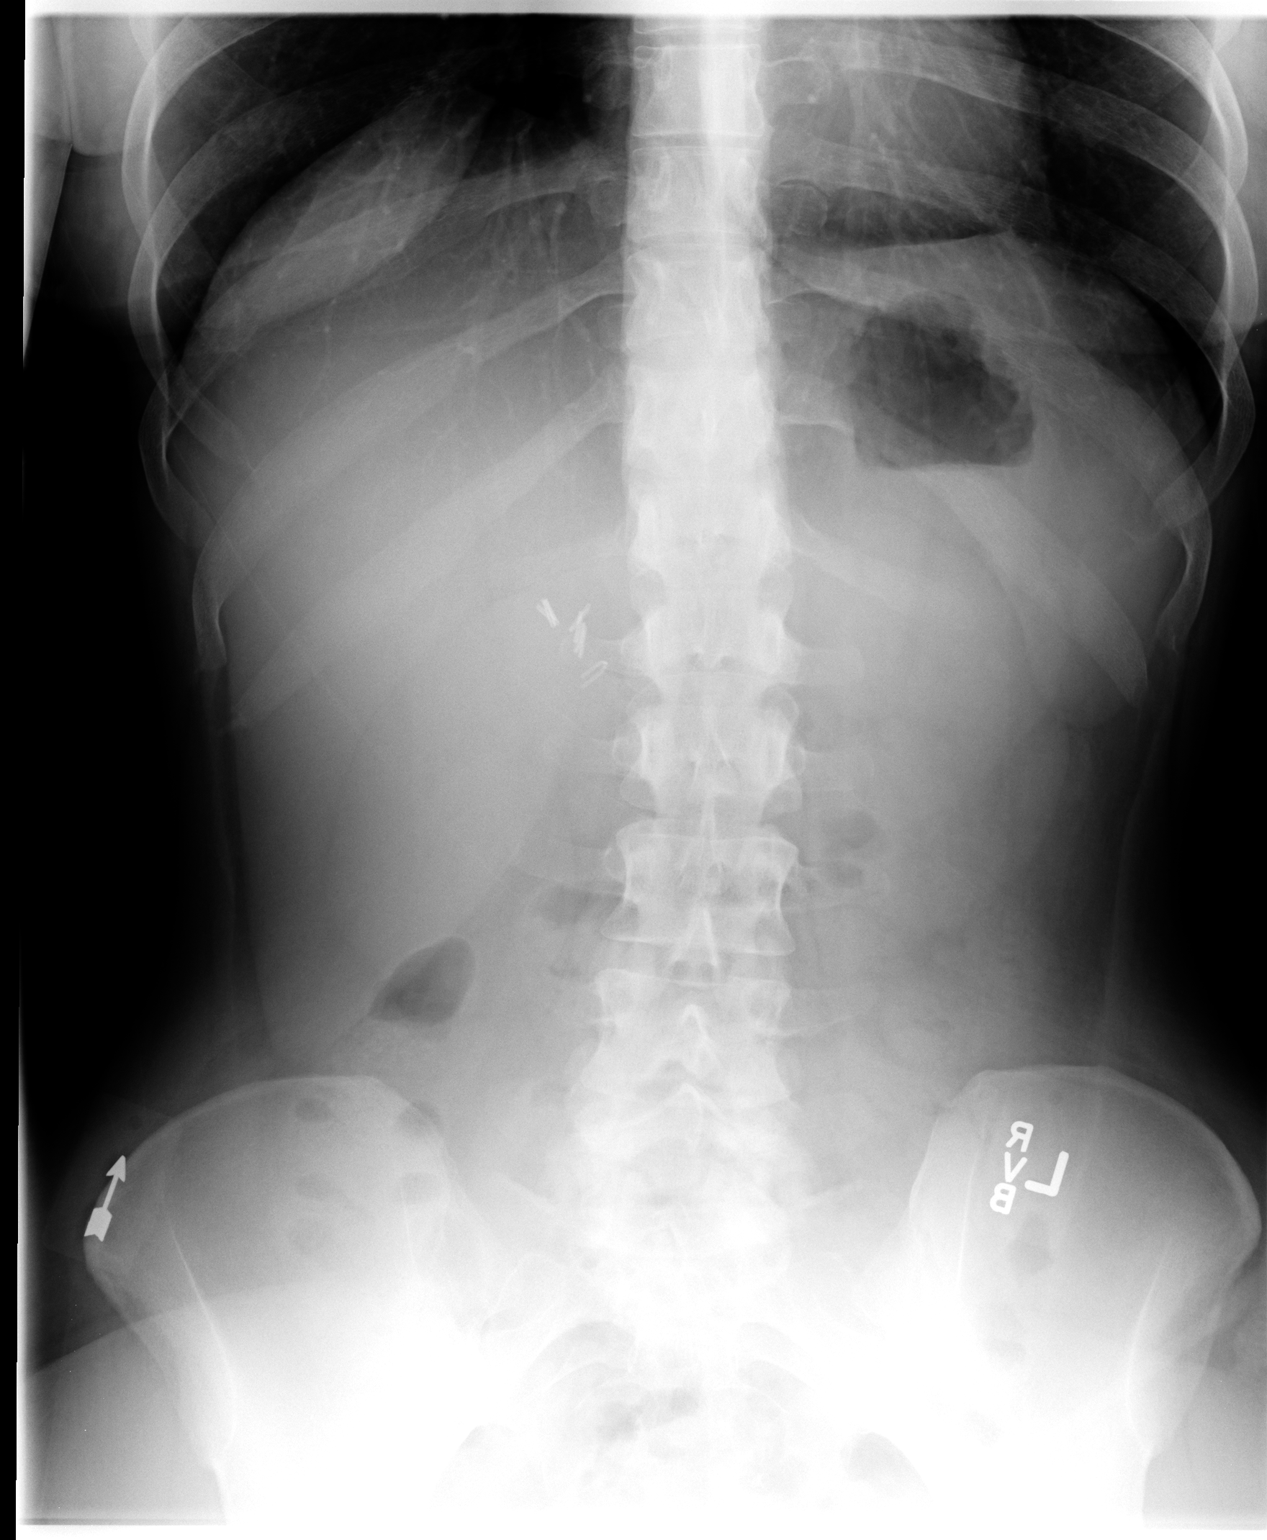

[view not recorded (3 of 3)]
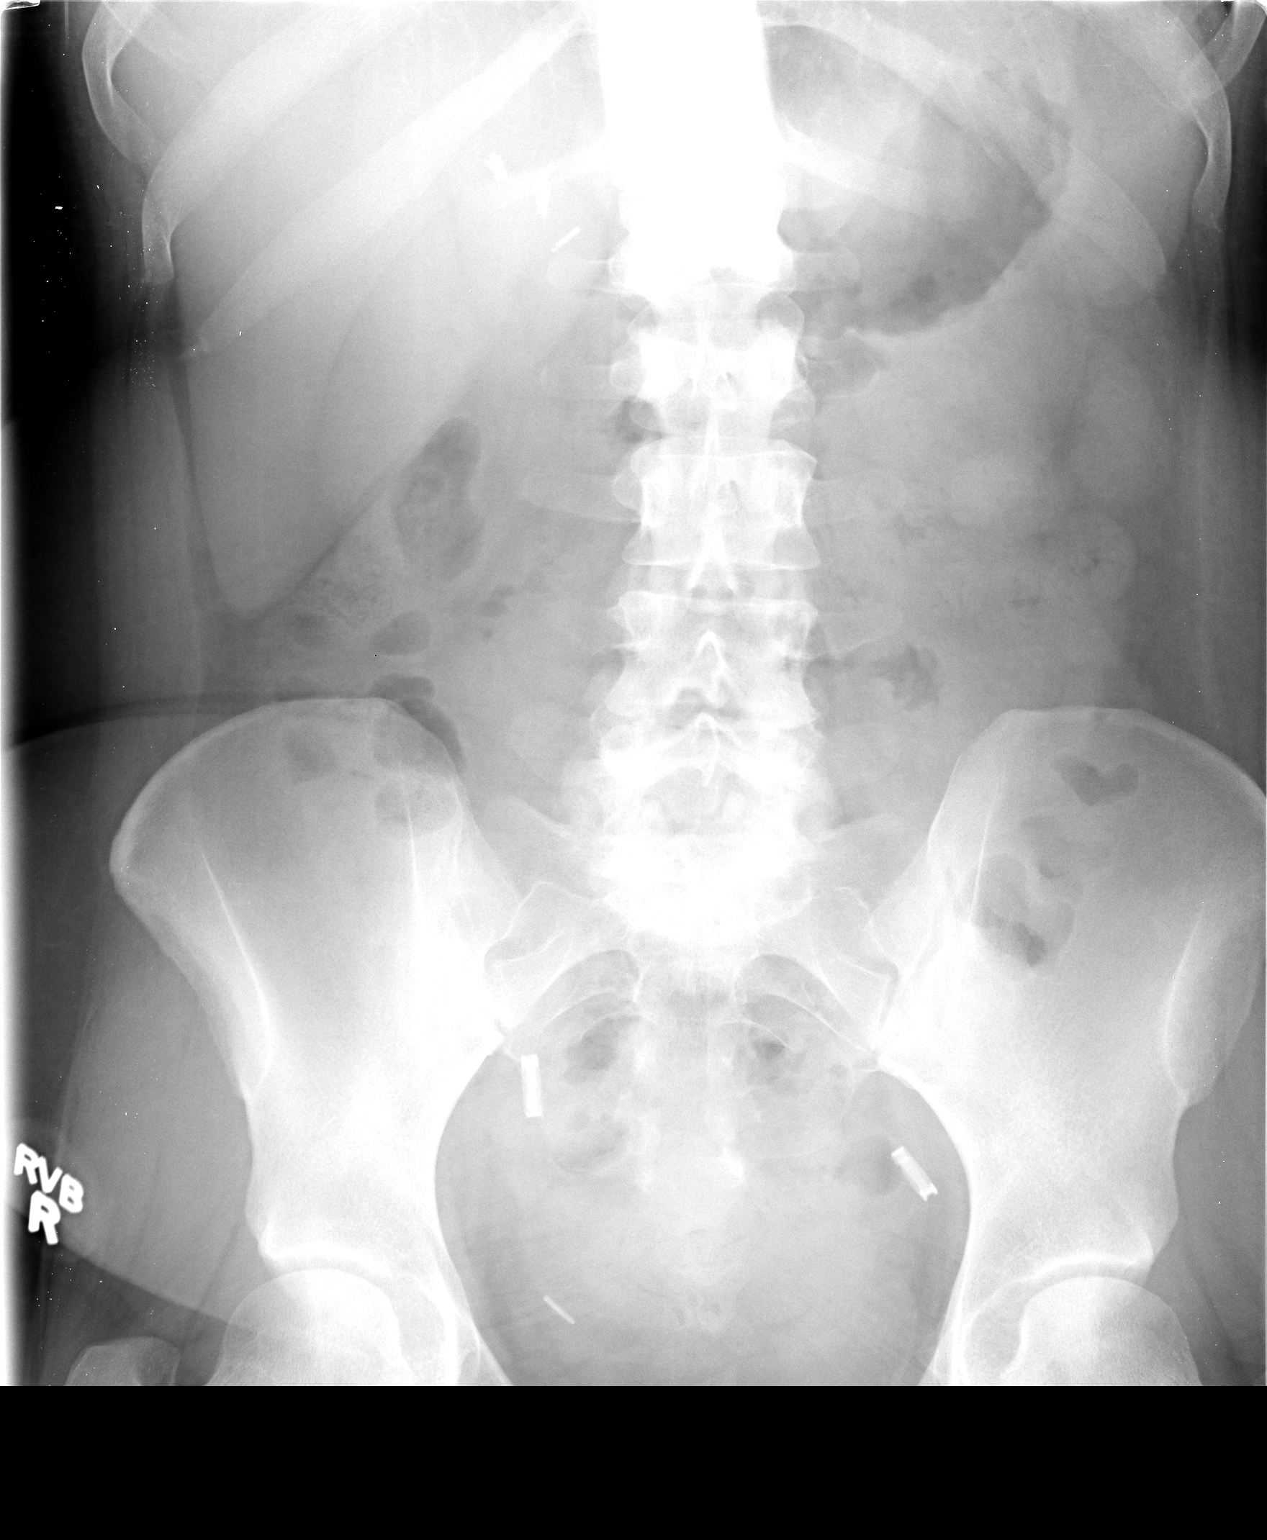

[3 of 3 positions shown; findings below may reference images not displayed]

FINDINGS: Single view of the chest demonstrates clear lungs and
normal heart size.  No pneumothorax or pleural effusion.

There is no free intraperitoneal air.  The bowel gas pattern is
normal.  Tubal ligation and cholecystectomy clips noted.
IMPRESSION: No acute finding.

## 2012-10-08 ENCOUNTER — Encounter (HOSPITAL_COMMUNITY): Payer: Self-pay | Admitting: Pharmacy Technician

## 2012-10-08 ENCOUNTER — Other Ambulatory Visit: Payer: Self-pay | Admitting: Obstetrics & Gynecology

## 2012-10-08 NOTE — Patient Instructions (Addendum)
Victoria Terrell  10/08/2012   Your procedure is scheduled on:   10/15/2012   Report to [facility] at  615  AM.  Call this number if you have problems the morning of surgery: [phone]   Remember:   Do not eat food or drink liquids after midnight.   Take these medicines the morning of surgery with A SIP OF WATER:  Norco,xanax,lexapro   Do not wear jewelry, make-up or nail polish.  Do not wear lotions, powders, or perfumes.   Do not shave 48 hours prior to surgery. Men may shave face and neck.  Do not bring valuables to the hospital.  Contacts, dentures or bridgework may not be worn into surgery.  Leave suitcase in the car. After surgery it may be brought to your room.  For patients admitted to the hospital, checkout time is 11:00 AM the day of discharge.   Patients discharged the day of surgery will not be allowed to drive  home.  Name and phone number of your driver: family  Special Instructions: Shower using CHG 2 nights before surgery and the night before surgery.  If you shower the day of surgery use CHG.  Use special wash - you have one bottle of CHG for all showers.  You should use approximately 1/3 of the bottle for each shower.   Please read over the following fact sheets that you were given: Pain Booklet, Coughing and Deep Breathing, MRSA Information, Surgical Site Infection Prevention, Anesthesia Post-op Instructions and Care and Recovery After Surgery Unilateral Salpingo-Oophorectomy Unilateral salpingo-oophorectomy is the removal of one fallopian tube and ovary. The fallopian tubes transport the egg from the ovary to the womb (uterus). The fallopian tube is also where the sperm and egg meet and become fertilized and move down into the uterus.  Removing one tube and ovary will not:  Cause problems with your menstrual periods.  Cause problems with your sex drive (libido).  Put you into the menopause.  Give symptoms of the menopause.  Make you not able to get  pregnant (sterile). There are several reasons for doing a salpingo-oophorectomy:  Infection of the tube and ovary.  There may be scar tissue of the tube and ovary (adhesions).  It may be necessary to remove the tube when the ovary has a cyst or tumor.  It may have to be done when removing the uterus.  It may have to be done when there is cancer of the tube or ovary. LET YOUR CAREGIVER KNOW ABOUT:  Allergies to food or medications.  All the medications you are taking including prescription and over-the-counter herbs, eye drops and creams.  If you are using illegal drugs or excessive alcohol.  Your smoking habits.  Previous problems with anesthesia including numbing medication.  The possibility of being pregnant.  History of blood clots or bleeding problems.  Previous surgery.  Any other medical or health problems. RISKS AND COMPLICATIONS  All surgery is associated with risks. Some of these risks are:  Injury to surrounding organs.  Bleeding.  Infection.  Blood clots in the legs or lungs.  Problems with the anesthesia.  The surgery does not help the problem.  Death. BEFORE THE PROCEDURE  Do not take aspirin or blood thinners because it can make you bleed.  Do not eat or drink anything at least 8 hours before the surgery.  Let your caregiver know if you develop a cold or an infection.  If you are being  admitted the day of surgery, arrive at least one hour before the surgery.  Arrange for help when you go home from the hospital.  If you smoke, do not smoke for at least 2 weeks before the surgery. PROCEDURE After being admitted to the hospital, you will change into a hospital gown. Then, you will be given an IV (intravenous) and a medication to relax you. Then, you will be put to sleep with an anesthetic. Any hair on your lower belly (abdomen) will be removed, and a catheter will be placed in your bladder. The fallopian tube and ovary will be removed either  through 2 very small cuts (incisions) or through large incision in the lower abdomen. The blood vessels will be clamped and tied. AFTER THE PROCEDURE  You will be taken to the recovery room for 1 to 3 hours until your blood pressure, pulse and temperature are stable and you are waking up.  If you had a laparoscopy, you may be discharged in several hours.  If you had a large incision, you will be admitted to the hospital for a day or two.  If you had a laparoscopy, you may have shoulder pain. This is not unusual. It is from air that is left in the abdomen and affects the nerve that goes from the diaphragm to the shoulder. It goes away in a day or two.  You will be given pain medication as necessary.  The intravenous and catheter will be removed before you are discharged.  Have someone available to take you home. HOME CARE INSTRUCTIONS   It is normal to be sore for a week or two. Call your caregiver if the pain is getting worse or the pain medication is not helping.  Have help when you go home for a week or so to help with the household chores.  Follow your caregiver's advice regarding diet.  Get rest and sleep.  Only take over-the-counter or prescription medicines for pain or discomfort as directed by your caregiver.  Do not take aspirin. It can cause bleeding.  Do not drive, exercise or lift anything over 5 pounds.  Do not drink alcohol until your caregiver gives you permission.  Do not lift anything over 5 pounds.  Do not have sexual intercourse until your caregiver says it is OK.  Take your temperature twice a day and write it down.  Change the bandage (dressing) as directed.  Make and keep your follow-up appointments for postoperative care.  If you become constipated, ask your caregiver about taking a mild laxative. Drinking more liquids than usual and eating bran foods can help prevent constipation. SEEK MEDICAL CARE IF:   You have swelling or redness around the cut  (incision).  You develop a rash.  You have side effects from the medication.  You feel lightheaded.  You need more or stronger medication.  You have pain, swelling or redness where the IV (intravenous) was placed. SEEK IMMEDIATE MEDICAL CARE IF:   You develop an unexplained temperature above 100 F (37.8 C).  You develop increasing belly (abdominal) pain.  You have pus coming out of the incision.  You notice a bad smell coming from the wound or dressing.  The incision is separating.  There is excessive vaginal bleeding.  You start to feel sick to your stomach (nauseous) and vomit.  You have leg or chest pain.  You have pain when you urinate.  You develop shortness of breath.  You pass out. Document Released: 07/01/2009 Document Revised: 11/26/2011  Document Reviewed: 07/01/2009 [facility] Patient Information 2013 [place], [place]. Hysterectomy Information  A hysterectomy is a procedure where your uterus is surgically removed. It will no longer be possible to have menstrual periods or to become pregnant. The tubes and ovaries can be removed (bilateral salpingo-oopherectomy) during this surgery as well.  REASONS FOR A HYSTERECTOMY  Persistent, abnormal bleeding.  Lasting (chronic) pelvic pain or infection.  The lining of the uterus (endometrium) starts growing outside the uterus (endometriosis).  The endometrium starts growing in the muscle of the uterus (adenomyosis).  The uterus falls down into the vagina (pelvic organ prolapse).  Symptomatic uterine fibroids.  Precancerous cells.  Cervical cancer or uterine cancer. TYPES OF HYSTERECTOMIES  Supracervical hysterectomy. This type removes the top part of the uterus, but not the cervix.  Total hysterectomy. This type removes the uterus and cervix.  Radical hysterectomy. This type removes the uterus, cervix, and the fibrous tissue that holds the uterus in place in the pelvis (parametrium). WAYS A HYSTERECTOMY CAN  BE PERFORMED  Abdominal hysterectomy. A large surgical cut (incision) is made in the abdomen. The uterus is removed through this incision.  Vaginal hysterectomy. An incision is made in the vagina. The uterus is removed through this incision. There are no abdominal incisions.  Conventional laparoscopic hysterectomy. A thin, lighted tube with a camera (laparoscope) is inserted into 3 or 4 small incisions in the abdomen. The uterus is cut into small pieces. The small pieces are removed through the incisions, or they are removed through the vagina.  Laparoscopic assisted vaginal hysterectomy (LAVH). Three or four small incisions are made in the abdomen. Part of the surgery is performed laparoscopically and part vaginally. The uterus is removed through the vagina.  Robot-assisted laparoscopic hysterectomy. A laparoscope is inserted into 3 or 4 small incisions in the abdomen. A computer-controlled device is used to give the surgeon a 3D image. This allows for more precise movements of surgical instruments. The uterus is cut into small pieces and removed through the incisions or removed through the vagina. RISKS OF HYSTERECTOMY   Bleeding and risk of blood transfusion. Tell your caregiver if you do not want to receive any blood products.  Blood clots in the legs or lung.  Infection.  Injury to surrounding organs.  Anesthesia problems or side effects.  Conversion to an abdominal hysterectomy. WHAT TO EXPECT AFTER A HYSTERECTOMY  You will be given pain medicine.  You will need to have someone with you for the first 3 to 5 days after you go home.  You will need to follow up with your surgeon in 2 to 4 weeks after surgery to evaluate your progress.  You may have early menopause symptoms like hot flashes, night sweats, and insomnia.  If you had a hysterectomy for a problem that was not a cancer or a condition that could lead to cancer, then you no longer need Pap tests. However, even if you no  longer need a Pap test, a regular exam is a good idea to make sure no other problems are starting. Document Released: 02/27/2001 Document Revised: 11/26/2011 Document Reviewed: 04/14/2011 [facility] Patient Information 2013 [place], [place]. PATIENT INSTRUCTIONS POST-ANESTHESIA  IMMEDIATELY FOLLOWING SURGERY:  Do not drive or operate machinery for the first twenty four hours after surgery.  Do not make any important decisions for twenty four hours after surgery or while taking narcotic pain medications or sedatives.  If you develop intractable nausea and vomiting or a severe headache please notify your doctor immediately.  FOLLOW-UP:  Please make an appointment with your surgeon as instructed. You do not need to follow up with anesthesia unless specifically instructed to do so.  WOUND CARE INSTRUCTIONS (if applicable):  Keep a dry clean dressing on the anesthesia/puncture wound site if there is drainage.  Once the wound has quit draining you may leave it open to air.  Generally you should leave the bandage intact for twenty four hours unless there is drainage.  If the epidural site drains for more than 36-48 hours please call the anesthesia department.  QUESTIONS?:  Please feel free to call your physician or the hospital operator if you have any questions, and they will be happy to assist you.

## 2012-10-09 ENCOUNTER — Encounter (HOSPITAL_COMMUNITY): Admission: RE | Admit: 2012-10-09 | Discharge: 2012-10-09 | Payer: Medicaid Other | Source: Ambulatory Visit

## 2012-10-10 ENCOUNTER — Encounter (HOSPITAL_COMMUNITY)
Admission: RE | Admit: 2012-10-10 | Discharge: 2012-10-10 | Disposition: A | Discharge status: Home / Self Care | Payer: Medicaid Other | Source: Ambulatory Visit | Attending: Obstetrics & Gynecology | Admitting: Obstetrics & Gynecology

## 2012-10-10 ENCOUNTER — Encounter (HOSPITAL_COMMUNITY): Payer: Self-pay

## 2012-10-10 LAB — CBC
Platelets: 198 10*3/uL (ref 150–400)
RBC: 4.19 MIL/uL (ref 3.87–5.11)
RDW: 14.6 % (ref 11.5–15.5)
WBC: 11.9 10*3/uL — ABNORMAL HIGH (ref 4.0–10.5)

## 2012-10-10 LAB — COMPREHENSIVE METABOLIC PANEL
ALT: 47 U/L — ABNORMAL HIGH (ref 0–35)
AST: 51 U/L — ABNORMAL HIGH (ref 0–37)
Albumin: 3.7 g/dL (ref 3.5–5.2)
CO2: 28 mEq/L (ref 19–32)
Calcium: 8.7 mg/dL (ref 8.4–10.5)
Chloride: 100 mEq/L (ref 96–112)
GFR calc non Af Amer: >90 mL/min (ref >90)
Sodium: 136 mEq/L (ref 135–145)

## 2012-10-10 LAB — URINALYSIS, ROUTINE W REFLEX MICROSCOPIC
Bilirubin Urine: NEGATIVE
Glucose, UA: NEGATIVE mg/dL
Ketones, ur: NEGATIVE mg/dL
Leukocytes, UA: NEGATIVE
Protein, ur: NEGATIVE mg/dL

## 2012-10-10 LAB — TYPE AND SCREEN: Antibody Screen: NEGATIVE

## 2012-10-10 NOTE — Patient Instructions (Signed)
Victoria Terrell  10/10/2012   Your procedure is scheduled on:  10/15/2012  Report to [facility] at 6:15 AM.  Call this number if you have problems the morning of surgery: [phone]   Remember:   Do not eat food or drink liquids after midnight.   Take these medicines the morning of surgery with A SIP OF WATER: Xanax, Lexapro and Norco   Do not wear jewelry, make-up or nail polish.  Do not wear lotions, powders, or perfumes. You may wear deodorant.  Do not shave 48 hours prior to surgery. Men may shave face and neck.  Do not bring valuables to the hospital.  Contacts, dentures or bridgework may not be worn into surgery.  Leave suitcase in the car. After surgery it may be brought to your room.  For patients admitted to the hospital, checkout time is 11:00 AM the day of  discharge.   Patients discharged the day of surgery will not be allowed to drive  home.  Name and phone number of your driver: friend  Special Instructions: Shower using CHG 2 nights before surgery and the night before surgery.  If you shower the day of surgery use CHG.  Use special wash - you have one bottle of CHG for all showers.  You should use approximately 1/3 of the bottle for each shower.   Please read over the following fact sheets that you were given: Care and Recovery After Surgery

## 2012-10-15 ENCOUNTER — Inpatient Hospital Stay (HOSPITAL_COMMUNITY): Payer: Medicaid Other | Admitting: Anesthesiology

## 2012-10-15 ENCOUNTER — Observation Stay (HOSPITAL_COMMUNITY)
Admission: RE | Admit: 2012-10-15 | Discharge: 2012-10-16 | Disposition: A | Discharge status: Home / Self Care | Payer: Medicaid Other | Source: Ambulatory Visit | Attending: Obstetrics & Gynecology | Admitting: Obstetrics & Gynecology

## 2012-10-15 ENCOUNTER — Encounter (HOSPITAL_COMMUNITY): Payer: Self-pay | Admitting: *Deleted

## 2012-10-15 ENCOUNTER — Encounter (HOSPITAL_COMMUNITY): Payer: Self-pay | Admitting: Anesthesiology

## 2012-10-15 ENCOUNTER — Encounter (HOSPITAL_COMMUNITY)
Admission: RE | Disposition: A | Discharge status: Home / Self Care | Payer: Self-pay | Source: Ambulatory Visit | Attending: Obstetrics & Gynecology

## 2012-10-15 DIAGNOSIS — Z01812 Encounter for preprocedural laboratory examination: Secondary | ICD-10-CM | POA: Insufficient documentation

## 2012-10-15 DIAGNOSIS — N946 Dysmenorrhea, unspecified: Principal | ICD-10-CM | POA: Insufficient documentation

## 2012-10-15 DIAGNOSIS — N949 Unspecified condition associated with female genital organs and menstrual cycle: Secondary | ICD-10-CM | POA: Diagnosis not present

## 2012-10-15 DIAGNOSIS — Z90710 Acquired absence of both cervix and uterus: Secondary | ICD-10-CM

## 2012-10-15 DIAGNOSIS — J45909 Unspecified asthma, uncomplicated: Secondary | ICD-10-CM | POA: Insufficient documentation

## 2012-10-15 DIAGNOSIS — IMO0002 Reserved for concepts with insufficient information to code with codable children: Secondary | ICD-10-CM | POA: Insufficient documentation

## 2012-10-15 HISTORY — PX: SALPINGOOPHORECTOMY: SHX82

## 2012-10-15 HISTORY — PX: ABDOMINAL HYSTERECTOMY: SHX81

## 2012-10-15 HISTORY — PX: BILATERAL SALPINGECTOMY: SHX5743

## 2012-10-15 LAB — TYPE AND SCREEN: Antibody Screen: NEGATIVE

## 2012-10-15 SURGERY — HYSTERECTOMY, ABDOMINAL
Anesthesia: General | Laterality: Right | Wound class: Clean Contaminated

## 2012-10-15 MED ORDER — ROCURONIUM BROMIDE 100 MG/10ML IV SOLN
INTRAVENOUS | Status: DC | PRN
  Administered 2012-10-15 (×3): 10 mg via INTRAVENOUS
  Administered 2012-10-15: 40 mg via INTRAVENOUS
  Administered 2012-10-15: 10 mg via INTRAVENOUS

## 2012-10-15 MED ORDER — LIDOCAINE HCL (CARDIAC) 20 MG/ML IV SOLN
INTRAVENOUS | Status: DC | PRN
  Administered 2012-10-15: 50 mg via INTRAVENOUS

## 2012-10-15 MED ORDER — FENTANYL CITRATE 0.05 MG/ML IJ SOLN
INTRAMUSCULAR | Status: AC
  Filled 2012-10-15: qty 5

## 2012-10-15 MED ORDER — PROMETHAZINE HCL 25 MG/ML IJ SOLN
12.5000 mg | Freq: Once | INTRAMUSCULAR | Status: AC
  Administered 2012-10-15: 12.5 mg via INTRAVENOUS

## 2012-10-15 MED ORDER — 0.9 % SODIUM CHLORIDE (POUR BTL) OPTIME
TOPICAL | Status: DC | PRN
  Administered 2012-10-15: 3000 mL

## 2012-10-15 MED ORDER — SODIUM CHLORIDE 0.9 % IJ SOLN: 9.0000 mL | INTRAMUSCULAR | Status: DC | PRN

## 2012-10-15 MED ORDER — FENTANYL 10 MCG/ML IV SOLN
INTRAVENOUS | Status: DC
  Administered 2012-10-15: 20:00:00 via INTRAVENOUS
  Administered 2012-10-16: 350 ug via INTRAVENOUS
  Filled 2012-10-15 (×2): qty 50

## 2012-10-15 MED ORDER — ONDANSETRON 8 MG/NS 50 ML IVPB
8.0000 mg | Freq: Four times a day (QID) | INTRAVENOUS | Status: DC | PRN
  Filled 2012-10-15: qty 8

## 2012-10-15 MED ORDER — NALOXONE HCL 0.4 MG/ML IJ SOLN: 0.4000 mg | INTRAMUSCULAR | Status: DC | PRN

## 2012-10-15 MED ORDER — HYDROMORPHONE HCL PF 1 MG/ML IJ SOLN
0.5000 mg | INTRAMUSCULAR | Status: DC | PRN
  Administered 2012-10-15 (×3): 0.5 mg via INTRAVENOUS

## 2012-10-15 MED ORDER — ONDANSETRON HCL 4 MG/2ML IJ SOLN
4.0000 mg | Freq: Once | INTRAMUSCULAR | Status: AC
  Administered 2012-10-15: 4 mg via INTRAVENOUS

## 2012-10-15 MED ORDER — OXYCODONE-ACETAMINOPHEN 5-325 MG PO TABS
1.5000 | ORAL_TABLET | ORAL | Status: DC | PRN
  Administered 2012-10-16 (×3): 3 via ORAL
  Filled 2012-10-15 (×3): qty 3

## 2012-10-15 MED ORDER — ZOLPIDEM TARTRATE 5 MG PO TABS: 10.0000 mg | ORAL_TABLET | Freq: Every evening | ORAL | Status: DC | PRN

## 2012-10-15 MED ORDER — ONDANSETRON HCL 4 MG/2ML IJ SOLN: 4.0000 mg | Freq: Four times a day (QID) | INTRAMUSCULAR | Status: DC | PRN

## 2012-10-15 MED ORDER — ONDANSETRON HCL 4 MG/2ML IJ SOLN: 4.0000 mg | Freq: Once | INTRAMUSCULAR | Status: DC | PRN

## 2012-10-15 MED ORDER — NEOSTIGMINE METHYLSULFATE 1 MG/ML IJ SOLN
INTRAMUSCULAR | Status: DC | PRN
  Administered 2012-10-15: 4 mg via INTRAVENOUS

## 2012-10-15 MED ORDER — FENTANYL CITRATE 0.05 MG/ML IJ SOLN
INTRAMUSCULAR | Status: DC | PRN
  Administered 2012-10-15 (×2): 50 ug via INTRAVENOUS
  Administered 2012-10-15: 100 ug via INTRAVENOUS
  Administered 2012-10-15 (×11): 50 ug via INTRAVENOUS

## 2012-10-15 MED ORDER — DIPHENHYDRAMINE HCL 50 MG/ML IJ SOLN: 12.5000 mg | Freq: Four times a day (QID) | INTRAMUSCULAR | Status: DC | PRN

## 2012-10-15 MED ORDER — LACTATED RINGERS IV SOLN
INTRAVENOUS | Status: DC
  Administered 2012-10-15 (×2): via INTRAVENOUS

## 2012-10-15 MED ORDER — ALUM & MAG HYDROXIDE-SIMETH 200-200-20 MG/5ML PO SUSP
30.0000 mL | ORAL | Status: DC | PRN
  Filled 2012-10-15 (×2): qty 30

## 2012-10-15 MED ORDER — FENTANYL CITRATE 0.05 MG/ML IJ SOLN
INTRAMUSCULAR | Status: AC
  Filled 2012-10-15: qty 2

## 2012-10-15 MED ORDER — FENTANYL 10 MCG/ML IV SOLN
INTRAVENOUS | Status: DC
  Filled 2012-10-15: qty 50

## 2012-10-15 MED ORDER — GLYCOPYRROLATE 0.2 MG/ML IJ SOLN
INTRAMUSCULAR | Status: AC
  Filled 2012-10-15: qty 4

## 2012-10-15 MED ORDER — CEFAZOLIN SODIUM-DEXTROSE 2-3 GM-% IV SOLR
INTRAVENOUS | Status: AC
  Filled 2012-10-15: qty 50

## 2012-10-15 MED ORDER — DIPHENHYDRAMINE HCL 12.5 MG/5ML PO ELIX: 12.5000 mg | ORAL_SOLUTION | Freq: Four times a day (QID) | ORAL | Status: DC | PRN

## 2012-10-15 MED ORDER — PROPOFOL 10 MG/ML IV BOLUS
INTRAVENOUS | Status: DC | PRN
  Administered 2012-10-15: 150 mg via INTRAVENOUS

## 2012-10-15 MED ORDER — PROMETHAZINE HCL 25 MG/ML IJ SOLN
INTRAMUSCULAR | Status: AC
  Filled 2012-10-15: qty 1

## 2012-10-15 MED ORDER — KCL IN DEXTROSE-NACL 20-5-0.45 MEQ/L-%-% IV SOLN
INTRAVENOUS | Status: DC
  Administered 2012-10-15 – 2012-10-16 (×3): via INTRAVENOUS

## 2012-10-15 MED ORDER — ROCURONIUM BROMIDE 50 MG/5ML IV SOLN
INTRAVENOUS | Status: AC
  Filled 2012-10-15: qty 1

## 2012-10-15 MED ORDER — ONDANSETRON HCL 4 MG/2ML IJ SOLN
INTRAMUSCULAR | Status: AC
  Filled 2012-10-15: qty 2

## 2012-10-15 MED ORDER — HYDROMORPHONE HCL PF 1 MG/ML IJ SOLN
1.0000 mg | INTRAMUSCULAR | Status: DC | PRN
  Administered 2012-10-15 (×3): 1 mg via INTRAVENOUS
  Administered 2012-10-15 (×2): 2 mg via INTRAVENOUS
  Filled 2012-10-15: qty 2
  Filled 2012-10-15 (×3): qty 1
  Filled 2012-10-15: qty 2

## 2012-10-15 MED ORDER — CEFAZOLIN SODIUM-DEXTROSE 2-3 GM-% IV SOLR
2.0000 g | INTRAVENOUS | Status: AC
  Administered 2012-10-15: 2 g via INTRAVENOUS

## 2012-10-15 MED ORDER — ONDANSETRON HCL 4 MG PO TABS
8.0000 mg | ORAL_TABLET | Freq: Four times a day (QID) | ORAL | Status: DC | PRN
  Administered 2012-10-15: 8 mg via ORAL
  Filled 2012-10-15: qty 2

## 2012-10-15 MED ORDER — LIDOCAINE HCL (PF) 1 % IJ SOLN
INTRAMUSCULAR | Status: AC
  Filled 2012-10-15: qty 5

## 2012-10-15 MED ORDER — ALPRAZOLAM 1 MG PO TABS
2.0000 mg | ORAL_TABLET | Freq: Two times a day (BID) | ORAL | Status: DC | PRN
  Administered 2012-10-15 – 2012-10-16 (×3): 2 mg via ORAL
  Filled 2012-10-15 (×3): qty 2

## 2012-10-15 MED ORDER — HYDROMORPHONE HCL PF 1 MG/ML IJ SOLN
INTRAMUSCULAR | Status: AC
  Filled 2012-10-15: qty 1

## 2012-10-15 MED ORDER — PROPOFOL 10 MG/ML IV EMUL
INTRAVENOUS | Status: AC
  Filled 2012-10-15: qty 20

## 2012-10-15 MED ORDER — SODIUM CHLORIDE 0.9 % IR SOLN: Status: DC | PRN

## 2012-10-15 MED ORDER — FENTANYL CITRATE 0.05 MG/ML IJ SOLN
25.0000 ug | INTRAMUSCULAR | Status: DC | PRN
  Administered 2012-10-15 (×2): 50 ug via INTRAVENOUS

## 2012-10-15 MED ORDER — GLYCOPYRROLATE 0.2 MG/ML IJ SOLN
INTRAMUSCULAR | Status: DC | PRN
  Administered 2012-10-15: .8 mg via INTRAVENOUS

## 2012-10-15 MED ORDER — ESCITALOPRAM OXALATE 10 MG PO TABS
20.0000 mg | ORAL_TABLET | Freq: Every morning | ORAL | Status: DC
  Administered 2012-10-15 – 2012-10-16 (×2): 20 mg via ORAL
  Filled 2012-10-15 (×2): qty 2

## 2012-10-15 MED ORDER — MIDAZOLAM HCL 2 MG/2ML IJ SOLN
1.0000 mg | INTRAMUSCULAR | Status: DC | PRN
  Administered 2012-10-15: 2 mg via INTRAVENOUS

## 2012-10-15 MED ORDER — MIDAZOLAM HCL 2 MG/2ML IJ SOLN
INTRAMUSCULAR | Status: AC
  Filled 2012-10-15: qty 2

## 2012-10-15 MED ORDER — DOCUSATE SODIUM 100 MG PO CAPS
100.0000 mg | ORAL_CAPSULE | Freq: Two times a day (BID) | ORAL | Status: DC
  Administered 2012-10-15 – 2012-10-16 (×2): 100 mg via ORAL
  Filled 2012-10-15 (×3): qty 1

## 2012-10-15 SURGICAL SUPPLY — 51 items
APPLIER CLIP 13 LRG OPEN (CLIP)
BAG HAMPER (MISCELLANEOUS) ×4 IMPLANT
BLADE SURG ROTATE 9660 (MISCELLANEOUS) ×4 IMPLANT
CELLS DAT CNTRL 66122 CELL SVR (MISCELLANEOUS) IMPLANT
CLIP APPLIE 13 LRG OPEN (CLIP) IMPLANT
CLOTH BEACON ORANGE TIMEOUT ST (SAFETY) ×4 IMPLANT
COVER LIGHT HANDLE STERIS (MISCELLANEOUS) ×8 IMPLANT
DERMABOND ADVANCED (GAUZE/BANDAGES/DRESSINGS) ×2
DERMABOND ADVANCED .7 DNX12 (GAUZE/BANDAGES/DRESSINGS) ×6 IMPLANT
DRAPE WARM FLUID 44X44 (DRAPE) ×4 IMPLANT
DRESSING TELFA 8X3 (GAUZE/BANDAGES/DRESSINGS) ×4 IMPLANT
ELECT REM PT RETURN 9FT ADLT (ELECTROSURGICAL) ×4
ELECTRODE REM PT RTRN 9FT ADLT (ELECTROSURGICAL) ×3 IMPLANT
FORMALIN 10 PREFIL 480ML (MISCELLANEOUS) IMPLANT
GLOVE BIOGEL PI IND STRL 8 (GLOVE) ×3 IMPLANT
GLOVE BIOGEL PI INDICATOR 8 (GLOVE) ×1
GLOVE ECLIPSE 6.5 STRL STRAW (GLOVE) ×4 IMPLANT
GLOVE ECLIPSE 8.0 STRL XLNG CF (GLOVE) ×4 IMPLANT
GLOVE INDICATOR 7.0 STRL GRN (GLOVE) ×12 IMPLANT
GLOVE INDICATOR 8.0 STRL GRN (GLOVE) ×4 IMPLANT
GLOVE SS BIOGEL STRL SZ 6.5 (GLOVE) ×3 IMPLANT
GLOVE SUPERSENSE BIOGEL SZ 6.5 (GLOVE) ×1
GOWN SRG XL XLNG 56XLVL 4 (GOWN DISPOSABLE) IMPLANT
GOWN STRL NON-REIN XL XLG LVL4 (GOWN DISPOSABLE)
GOWN STRL REIN XL XLG (GOWN DISPOSABLE) ×16 IMPLANT
INST SET MAJOR GENERAL (KITS) ×4 IMPLANT
KIT ROOM TURNOVER APOR (KITS) ×4 IMPLANT
MANIFOLD NEPTUNE II (INSTRUMENTS) ×4 IMPLANT
NS IRRIG 1000ML POUR BTL (IV SOLUTION) ×12 IMPLANT
PACK ABDOMINAL MAJOR (CUSTOM PROCEDURE TRAY) ×4 IMPLANT
PAD ARMBOARD 7.5X6 YLW CONV (MISCELLANEOUS) ×4 IMPLANT
RETRACTOR WND ALEXIS 25 LRG (MISCELLANEOUS) ×3 IMPLANT
RTRCTR WOUND ALEXIS 18CM MED (MISCELLANEOUS)
RTRCTR WOUND ALEXIS 25CM LRG (MISCELLANEOUS) ×4
SEPRAFILM MEMBRANE 5X6 (MISCELLANEOUS) IMPLANT
SET BASIN LINEN APH (SET/KITS/TRAYS/PACK) ×4 IMPLANT
SPONGE LAP 18X18 X RAY DECT (DISPOSABLE) ×4 IMPLANT
STAPLER VISISTAT 35W (STAPLE) ×4 IMPLANT
SUT CHROMIC 0 CT 1 (SUTURE) ×4 IMPLANT
SUT MNCRL+ AB 3-0 CT1 36 (SUTURE) ×3 IMPLANT
SUT MON AB 3-0 SH 27 (SUTURE) ×4 IMPLANT
SUT MONOCRYL AB 3-0 CT1 36IN (SUTURE) ×1
SUT PLAIN 2 0 XLH (SUTURE) ×4 IMPLANT
SUT VIC AB 0 CT1 27 (SUTURE) ×4
SUT VIC AB 0 CT1 27XBRD ANTBC (SUTURE) ×3 IMPLANT
SUT VIC AB 0 CT1 27XCR 8 STRN (SUTURE) ×9 IMPLANT
SUT VIC AB 0 CTX 36 (SUTURE) ×1
SUT VIC AB 0 CTX36XBRD ANTBCTR (SUTURE) ×3 IMPLANT
SUT VICRYL 3 0 (SUTURE) IMPLANT
TOWEL BLUE STERILE X RAY DET (MISCELLANEOUS) IMPLANT
TRAY FOLEY CATH 14FR (SET/KITS/TRAYS/PACK) ×4 IMPLANT
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
## 2012-10-15 NOTE — Op Note (Signed)
Preoperative diagnosis:  1.  Dysmenorrhea                                          2.  Chronic Pelvic pain, right sided                                         3.  Dyspareunia                                         4.  Dysfunctional Uterine bleeding  Postoperative diagnosis:  Same as above  Procedure:  Total Abdominal hysterectomy with right salpingo oophorectomy and left salpingectomy  Surgeon:  [name]  Assistant:    Anesthesia:  General endotracheal  Preoperative clinical summary:  Patient has pelvic pain dyspareunia DUB and dysmenorrhea constantly since her last Caesarean section.  She does have a history of over use and inappropriate use of narcotics which I have closely regulated with this pain problem.  After discussing options I essentially have nothing to offer to help with her  bumpdyspareunia short of a hysterectomy with removal of cervix and since she also has right lower quadrant pain even with a normal sonogram I recommended a right salpingo oophorectomy which sehe also thought was most prudent management.  Intraoperative findings: 4 cm anterior myoma, both ovaries normal, severe adhesions of bladder to lower uterine segment  Description of operation:  Patient was taken to the operating room and placed in the supine position where she underwent general endotracheal anesthesia.  She was then prepped and draped in the usual sterile fashion and a Foley catheter was placed for continuous bladder drainage.  A Pfannenstiel skin incision was made and carried down sharply to the rectus fascia which was scored in the midline and extended laterally.  The fascia was taken off the muscles superiorly and inferiorly without difficulty.  The muscles were divided.  The peritoneal cavity was entered.  An large Alexis self-retaining retractor was placed.  The upper abdomen was packed away. Both uterine cornu were grasped with Coker clamps.  The left round ligament was suture ligated and  coagulated with the electrocautery unit.  The left vesicouterine serosal flap was created.  An avascular window in in the peritoneum was created and the utero-ovarian ligament was cross clamped, cut and suture ligated.  A left salpingectomy was then performed.  The right round ligament was suture ligated and cut with the electrocautery unit.  The vesicouterine serosal flap on the right was created.  An avascular window in the peritoneum was created and the right infundibulo pelvic ligament was cross clamped, cut and double suture ligated.  Thus the right ovary was removed and the left ovary was preserved.  The uterine vessels were skeletonized bilaterally.  The uterine vessels were clamped bilaterally,  then cut and suture ligated.  Two more pedicles were taken down the cervix medial to the uterine vessels.  Each pedicle was clamped cut and suture ligated with good resulting hemostasis.  Serial pedicles were then taken down the cervix through the cardinal ligament.  Each pedicle was clamped cut and suture ligated in the usual fashion.  The vagina was then closed in a figure of eight  fashion for hemostasis.  The pelvis was irrigated vigorously and all pedicles were examined and found to be hemostatic.      All specimens were sent to pathology for routine evaluation.  The Alexis self-retaining retractor was removed and the pelvis was irrigated vigorously.  All packs were removed and all counts were correct at this point x 3.  The muscles and peritoneum were reapproximated loosely.  The fascia was closed with 0 Vicryl running.  The subcutaneous tissue was reapproximated using 2-0 plain gut.  The skin was closed using 3-0 Vicryl on a Keith needle in a subcuticular fashion.  Dermabond was then applied for additional wound integrity and to serve as a postoperative bacterial barrier.  The patient was awakened from anesthesia taken to the recovery room in good stable condition. All sponge instrument and needle counts were  correct x 3.  The patient received 2 grams of Ancef  prophylactically preoperatively.    Estimated blood loss for the procedure was 150  cc.  Victoria Terrell H 10/15/2012 10:08 AM

## 2012-10-15 NOTE — Progress Notes (Signed)
UR Chart Review Completed  

## 2012-10-15 NOTE — Anesthesia Preprocedure Evaluation (Signed)
Anesthesia Evaluation  Patient identified by MRN, date of birth, ID band Patient awake    Reviewed: Allergy & Precautions, H&P , NPO status , Patient's Chart, lab work & pertinent test results  History of Anesthesia Complications Negative for: history of anesthetic complications  Airway Mallampati: II      Dental  (+) Teeth Intact   Pulmonary shortness of breath, asthma ,  breath sounds clear to auscultation        Cardiovascular negative cardio ROS  Rhythm:Regular Rate:Normal     Neuro/Psych    GI/Hepatic GERD-  Medicated,  Endo/Other    Renal/GU      Musculoskeletal   Abdominal   Peds  Hematology   Anesthesia Other Findings   Reproductive/Obstetrics                           Anesthesia Physical Anesthesia Plan  ASA: II  Anesthesia Plan: General   Post-op Pain Management:    Induction: Intravenous, Rapid sequence and Cricoid pressure planned  Airway Management Planned: Oral ETT  Additional Equipment:   Intra-op Plan:   Post-operative Plan: Extubation in OR  Informed Consent: I have reviewed the patients History and Physical, chart, labs and discussed the procedure including the risks, benefits and alternatives for the proposed anesthesia with the patient or authorized representative who has indicated his/her understanding and acceptance.     Plan Discussed with:   Anesthesia Plan Comments:         Anesthesia Quick Evaluation

## 2012-10-15 NOTE — Progress Notes (Signed)
Day of Surgery Procedure(s) (LRB): HYSTERECTOMY ABDOMINAL (N/A) SALPINGO OOPHORECTOMY (Right) BILATERAL SALPINGECTOMY (Bilateral)  Subjective: Patient reports incisional pain. Pain not relieved by current Dilaudid dosing   Objective: I have reviewed patient's vital signs, intake and output and medications.    Assessment: s/p Procedure(s) (LRB) with comments: HYSTERECTOMY ABDOMINAL (N/A) SALPINGO OOPHORECTOMY (Right) - abdominal hysterectomy with right salpingo-oophorectomy BILATERAL SALPINGECTOMY (Bilateral): suboptimal pain control  Plan: Switch to Fentanyl PCA.   LOS: 0 days    Victoria Terrell V 10/15/2012, 6:55 PM

## 2012-10-15 NOTE — Transfer of Care (Signed)
Immediate Anesthesia Transfer of Care Note  Patient: Victoria Terrell  Procedure(s) Performed: Procedure(s) (LRB) with comments: HYSTERECTOMY ABDOMINAL (N/A) SALPINGO OOPHORECTOMY (Right) - abdominal hysterectomy with right salpingo-oophorectomy BILATERAL SALPINGECTOMY (Bilateral)  Patient Location: PACU  Anesthesia Type:General  Level of Consciousness: awake, alert  and oriented  Airway & Oxygen Therapy: Patient Spontanous Breathing and Patient connected to face mask oxygen  Post-op Assessment: Report given to PACU RN  Post vital signs: Reviewed and stable  Complications: No apparent anesthesia complications

## 2012-10-15 NOTE — Anesthesia Postprocedure Evaluation (Signed)
  Anesthesia Post-op Note  Patient: Victoria Terrell  Procedure(s) Performed: Procedure(s) (LRB) with comments: HYSTERECTOMY ABDOMINAL (N/A) SALPINGO OOPHORECTOMY (Right) - abdominal hysterectomy with right salpingo-oophorectomy BILATERAL SALPINGECTOMY (Bilateral)  Patient Location: PACU  Anesthesia Type:General  Level of Consciousness: awake, alert  and oriented  Airway and Oxygen Therapy: Patient Spontanous Breathing and Patient connected to face mask oxygen  Post-op Pain: mild  Post-op Assessment: Post-op Vital signs reviewed, Patient's Cardiovascular Status Stable, Respiratory Function Stable, Patent Airway and No signs of Nausea or vomiting  Post-op Vital Signs: Reviewed and stable  Complications: No apparent anesthesia complications

## 2012-10-15 NOTE — Anesthesia Procedure Notes (Signed)
Procedure Name: Intubation Date/Time: 10/15/2012 8:05 AM Performed by: Glynn Octave E Pre-anesthesia Checklist: Patient identified, Patient being monitored, Timeout performed, Emergency Drugs available and Suction available Patient Re-evaluated:Patient Re-evaluated prior to inductionOxygen Delivery Method: Circle System Utilized Preoxygenation: Pre-oxygenation with 100% oxygen Intubation Type: IV induction, Rapid sequence and Cricoid Pressure applied Laryngoscope Size: Mac and 3 Grade View: Grade I Tube type: Oral Tube size: 7.0 mm Number of attempts: 1 Airway Equipment and Method: stylet Placement Confirmation: ETT inserted through vocal cords under direct vision,  positive ETCO2 and breath sounds checked- equal and bilateral Secured at: 22 cm Tube secured with: Tape Dental Injury: Teeth and Oropharynx as per pre-operative assessment

## 2012-10-16 LAB — BASIC METABOLIC PANEL
BUN: 5 mg/dL — ABNORMAL LOW (ref 6–23)
Calcium: 8.8 mg/dL (ref 8.4–10.5)
GFR calc non Af Amer: >90 mL/min (ref >90)
Glucose, Bld: 121 mg/dL — ABNORMAL HIGH (ref 70–99)
Sodium: 138 mEq/L (ref 135–145)

## 2012-10-16 LAB — CBC
Hemoglobin: 12.4 g/dL (ref 12.0–15.0)
MCH: 28.4 pg (ref 26.0–34.0)
MCHC: 33 g/dL (ref 30.0–36.0)

## 2012-10-16 MED ORDER — DIPHENHYDRAMINE HCL 12.5 MG/5ML PO ELIX: 12.5000 mg | ORAL_SOLUTION | Freq: Four times a day (QID) | ORAL | Status: DC | PRN

## 2012-10-16 MED ORDER — NALOXONE HCL 0.4 MG/ML IJ SOLN: 0.4000 mg | INTRAMUSCULAR | Status: DC | PRN

## 2012-10-16 MED ORDER — HYDROMORPHONE 0.3 MG/ML IV SOLN
INTRAVENOUS | Status: DC
  Administered 2012-10-16: 01:00:00 via INTRAVENOUS
  Administered 2012-10-16: 5.3 mg via INTRAVENOUS
  Filled 2012-10-16: qty 25

## 2012-10-16 MED ORDER — ZOLPIDEM TARTRATE 10 MG PO TABS: 10.0000 mg | ORAL_TABLET | Freq: Every evening | ORAL | Status: DC | PRN

## 2012-10-16 MED ORDER — OXYCODONE-ACETAMINOPHEN 7.5-500 MG PO TABS: 1.0000 | ORAL_TABLET | Freq: Four times a day (QID) | ORAL | Status: DC | PRN

## 2012-10-16 MED ORDER — ONDANSETRON HCL 4 MG/2ML IJ SOLN: 4.0000 mg | Freq: Four times a day (QID) | INTRAMUSCULAR | Status: DC | PRN

## 2012-10-16 MED ORDER — SODIUM CHLORIDE 0.9 % IJ SOLN: 9.0000 mL | INTRAMUSCULAR | Status: DC | PRN

## 2012-10-16 MED ORDER — DIPHENHYDRAMINE HCL 50 MG/ML IJ SOLN: 12.5000 mg | Freq: Four times a day (QID) | INTRAMUSCULAR | Status: DC | PRN

## 2012-10-16 MED ORDER — ONDANSETRON HCL 8 MG PO TABS: 8.0000 mg | ORAL_TABLET | Freq: Four times a day (QID) | ORAL | Status: DC | PRN

## 2012-10-16 NOTE — Plan of Care (Signed)
Problem: Phase I Progression Outcomes Goal: Pain controlled with appropriate interventions Outcome: Progressing Dilaudid IV for pain control Goal: Dangle/OOB as tolerated per MD order Outcome: Not Met (add Reason) Pt having increased pain  Goal: Other Phase I Outcomes/Goals Outcome: Completed/Met Date Met:  10/16/12 Spoke with Dr Emelda Fear about pain control for pt  Problem: Phase II Progression Outcomes Goal: Pain controlled on oral analgesia Outcome: Adequate for Discharge Percocet 3 tabs q4 hours Goal: Progress activity as tolerated unless otherwise ordered Outcome: Completed/Met Date Met:  10/16/12 Ambulating in room Voiding without problems Quantity suficient Goal: Incision/dressings dry and intact Outcome: Completed/Met Date Met:  10/16/12 Skin Glue Goal: Other Phase II Outcomes/Goals Outcome: Adequate for Discharge Pain tolerated by pt

## 2012-10-16 NOTE — Addendum Note (Signed)
Addendum  created 10/16/12 0918 by Sueellen Kayes J Cherylynn Liszewski, CRNA   Modules edited:Notes Section    

## 2012-10-16 NOTE — Progress Notes (Signed)
1 Day Post-Op Procedure(s) (LRB): HYSTERECTOMY ABDOMINAL (N/A) SALPINGO OOPHORECTOMY (Right) BILATERAL SALPINGECTOMY (Bilateral)  Subjective: Patient reports nausea, incisional pain and tolerating PO.  Patient as expected struggling with her pain requirements as she has had significant issues in the past.  i encouraged her to understand the larger picture as far as narcotic use is concerned.  Objective:  Filed Vitals:   10/16/12 0120 10/16/12 0230 10/16/12 0427 10/16/12 0455  BP: 125/74 126/80 107/68   Pulse: 56 59 60   Temp:   98.9 F (37.2 C)   TempSrc:   Oral   Resp: 16 16 18 16   Height:      Weight:      SpO2: 97% 98% 97% 98%     I have reviewed patient's vital signs, intake and output, medications and labs.  General: alert, cooperative and mild distress GI: soft, non-tender; bowel sounds normal; no masses,  no organomegaly and incision: clean, dry and intact   Assessment: s/p Procedure(s) (LRB) with comments: HYSTERECTOMY ABDOMINAL (N/A) SALPINGO OOPHORECTOMY (Right) - abdominal hysterectomy with right salpingo-oophorectomy BILATERAL SALPINGECTOMY (Bilateral): stable  Plan: Encourage ambulation Advance to PO medication Discontinue IV fluids  LOS: 1 day    EURE,LUTHER H 10/16/2012, 10:17 AM

## 2012-10-16 NOTE — Plan of Care (Signed)
Problem: Discharge Progression Outcomes Goal: Barriers To Progression Addressed/Resolved Outcome: Adequate for Discharge Pain controled with po meds

## 2012-10-16 NOTE — Progress Notes (Signed)
D-Patient complained of extreme pain not relieved by Dilaudid IVP on previous shift.  Previous nurse contacted Dr. Emelda Fear and he placed orders for Fentanyl PCA.  Fentanyl PCA was initiated at 2025 and a load dose was given.  Vitals remained stable.  At 2345 pt reports fentanyl is not controlling her pain.  Pt is moaning and inconsolable  due to pain.  She had reached her max dose of Fentanyl for a 4 hour period of time at 2345.  Pt's husband insisted I call the MD for different pain control.  A-I paged Dr. Despina Hidden and made him aware of the above.  He gave new orders to change her to a Dilaudid PCA.  I inquired about giving her the percocet and he told me not to give the percocet while she is getting the PCA.  He informed me the patient has a history of heavy narcotic abuse and no further orders given.  I informed patient of the new orders and of the measures be done to control her pain and that we may not be able to control all of her pain but I would do all I had orders for at which time she became angry and stated "I will go to another hospital".  Pt's new dilaudid PCA was initiated at 0035 at which time she also complained of gas pains.  I attempted to give her a dose of Maalox per her orders and she refused stating she could not take it in that much pain.  I gave her a load dose of Dilaudid PCA 0.5mg  and told her to call me when she felt her pain was better under control and could take PO medications Maalox and her xanax.  She then told me she could take the xanax but not the maalox.  I gave her the xanax per her orders at 0050.  The nursing supervisor went in to check on patient around 0100 after being made aware of her pain issues and pt's threat to go to another hospital.  At 0120 patient reports pain 8/10 when aroused from sleep and states she can deal with pain being an 8 with the dilaudid.  Pt found to be sleeping at 0230 and was not aroused.  R- At 0455 Pt has used 5.3mg  of Dilaudid PCA and her vitals  remain WDL.  Pt was sleeping and easily aroused and quickly returned to sleep.

## 2012-10-16 NOTE — Anesthesia Postprocedure Evaluation (Signed)
  Anesthesia Post-op Note  Patient: Victoria Terrell  Procedure(s) Performed: Procedure(s) (LRB) with comments: HYSTERECTOMY ABDOMINAL (N/A) SALPINGO OOPHORECTOMY (Right) - abdominal hysterectomy with right salpingo-oophorectomy BILATERAL SALPINGECTOMY (Bilateral)  Patient Location: room 330  Anesthesia Type:General  Level of Consciousness: awake, alert  and patient cooperative  Airway and Oxygen Therapy: Patient Spontanous Breathing and Patient connected to nasal cannula oxygen  Post-op Pain: 5 /10, moderate  Post-op Assessment: Post-op Vital signs reviewed, Patient's Cardiovascular Status Stable, Respiratory Function Stable, Patent Airway and Adequate PO intake  Post-op Vital Signs: Reviewed and stable  Complications: No apparent anesthesia complications

## 2012-10-16 NOTE — Plan of Care (Signed)
Problem: Discharge Progression Outcomes Goal: Activity appropriate for discharge plan Outcome: Completed/Met Date Met:  10/16/12 Up in chair ambulating in room Goal: Other Discharge Outcomes/Goals Outcome: Completed/Met Date Met:  10/16/12 Pt will call for follow up per Dr Despina Hidden request  Pt pain is tolerable on po medication Voiding. Peri pad scant red discharge Incision is approximated with skin bond

## 2012-10-16 NOTE — Discharge Summary (Signed)
Physician Discharge Summary  Patient ID: Victoria Terrell MRN: [id number] DOB/AGE: December 05, 1980 32 y.o.  Admit date: 10/15/2012 Discharge date: 10/16/2012  Admission Diagnoses: Chronic pelvic pain, dyspareunia and DUB Discharge Diagnoses:  same  Discharged Condition: good  Hospital Course: unremarkable  Consults: None  Significant Diagnostic Studies: none  Treatments: surgery: hysterectomy, abdominal with removal of right ovary and both tubes  Discharge Exam: Blood pressure 126/79, pulse 73, temperature 98.3 F (36.8 C), temperature source Oral, resp. rate 18, height 5\' 7"  (1.702 m), weight 95.255 kg (210 lb), last menstrual period 10/14/2012, SpO2 95.00%. General appearance: alert, cooperative and mild distress GI: soft, non-tender; bowel sounds normal; no masses,  no organomegaly Incision/Wound:clean dry intact  Disposition: 01-Home or Self Care  Discharge Orders    Future Orders Please Complete By Expires   Diet - low sodium heart healthy      Increase activity slowly      Driving Restrictions      Comments:   No driving  for now   Lifting restrictions      Comments:   No more than 10 pounds   Sexual Activity Restrictions      Comments:   No sex until dr Despina Hidden releases   Call MD for:  temperature >100.4      Call MD for:  persistant nausea and vomiting      Call MD for:  severe uncontrolled pain          Medication List     As of 10/16/2012  5:48 PM    STOP taking these medications         acetaminophen 500 MG tablet   Commonly known as: TYLENOL      HYDROcodone-acetaminophen 5-325 MG per tablet   Commonly known as: NORCO/VICODIN      TAKE these medications         alprazolam 2 MG tablet   Commonly known as: XANAX   Take 2 mg by mouth daily as needed. Anxiety.      bismuth subsalicylate 262 MG/15ML suspension   Commonly known as: PEPTO BISMOL   Take 15 mLs by mouth every 6 (six) hours as needed. Diarrhea.      escitalopram 20 MG tablet   Commonly known as: LEXAPRO   Take 20 mg by mouth every morning.      HYDROcodone-acetaminophen 7.5-325 MG per tablet   Commonly known as: NORCO   Take 1 tablet by mouth every 4 (four) hours as needed. pain      ondansetron 8 MG tablet   Commonly known as: ZOFRAN   Take 1 tablet (8 mg total) by mouth every 6 (six) hours as needed for nausea.      ondansetron 4 MG tablet   Commonly known as: ZOFRAN   Take 4 mg by mouth every 6 (six) hours as needed. nausea      oxyCODONE-acetaminophen 7.5-500 MG per tablet   Commonly known as: PERCOCET   Take 1-2 tablets by mouth every 6 (six) hours as needed for pain.      zolpidem 10 MG tablet   Commonly known as: AMBIEN   Take 1 tablet (10 mg total) by mouth at bedtime as needed for sleep.           Follow-up Information    Follow up with [name], MD.   Contact information:   [address] [place] [address] [phone]          Signed: [name] 10/16/2012, 5:48 PM

## 2012-10-17 ENCOUNTER — Encounter (HOSPITAL_COMMUNITY): Payer: Self-pay | Admitting: Obstetrics & Gynecology

## 2012-10-25 ENCOUNTER — Encounter (HOSPITAL_COMMUNITY): Payer: Self-pay | Admitting: *Deleted

## 2012-10-25 ENCOUNTER — Emergency Department (HOSPITAL_COMMUNITY)
Admission: EM | Admit: 2012-10-25 | Discharge: 2012-10-25 | Disposition: A | Discharge status: Home / Self Care | Payer: Medicaid Other | Attending: Emergency Medicine | Admitting: Emergency Medicine

## 2012-10-25 ENCOUNTER — Emergency Department (HOSPITAL_COMMUNITY): Payer: Medicaid Other

## 2012-10-25 DIAGNOSIS — F3289 Other specified depressive episodes: Secondary | ICD-10-CM | POA: Insufficient documentation

## 2012-10-25 DIAGNOSIS — F1920 Other psychoactive substance dependence, uncomplicated: Secondary | ICD-10-CM | POA: Insufficient documentation

## 2012-10-25 DIAGNOSIS — T7491XA Unspecified adult maltreatment, confirmed, initial encounter: Secondary | ICD-10-CM | POA: Insufficient documentation

## 2012-10-25 DIAGNOSIS — G8929 Other chronic pain: Secondary | ICD-10-CM | POA: Insufficient documentation

## 2012-10-25 DIAGNOSIS — Z8739 Personal history of other diseases of the musculoskeletal system and connective tissue: Secondary | ICD-10-CM | POA: Insufficient documentation

## 2012-10-25 DIAGNOSIS — T7492XA Unspecified child maltreatment, confirmed, initial encounter: Secondary | ICD-10-CM | POA: Insufficient documentation

## 2012-10-25 DIAGNOSIS — Z8669 Personal history of other diseases of the nervous system and sense organs: Secondary | ICD-10-CM | POA: Insufficient documentation

## 2012-10-25 DIAGNOSIS — K219 Gastro-esophageal reflux disease without esophagitis: Secondary | ICD-10-CM | POA: Insufficient documentation

## 2012-10-25 DIAGNOSIS — F17200 Nicotine dependence, unspecified, uncomplicated: Secondary | ICD-10-CM | POA: Insufficient documentation

## 2012-10-25 DIAGNOSIS — Z3202 Encounter for pregnancy test, result negative: Secondary | ICD-10-CM | POA: Insufficient documentation

## 2012-10-25 DIAGNOSIS — J45909 Unspecified asthma, uncomplicated: Secondary | ICD-10-CM | POA: Insufficient documentation

## 2012-10-25 DIAGNOSIS — Z79899 Other long term (current) drug therapy: Secondary | ICD-10-CM | POA: Insufficient documentation

## 2012-10-25 DIAGNOSIS — F329 Major depressive disorder, single episode, unspecified: Secondary | ICD-10-CM | POA: Insufficient documentation

## 2012-10-25 DIAGNOSIS — F411 Generalized anxiety disorder: Secondary | ICD-10-CM | POA: Insufficient documentation

## 2012-10-25 DIAGNOSIS — S39012A Strain of muscle, fascia and tendon of lower back, initial encounter: Secondary | ICD-10-CM

## 2012-10-25 DIAGNOSIS — S335XXA Sprain of ligaments of lumbar spine, initial encounter: Secondary | ICD-10-CM | POA: Insufficient documentation

## 2012-10-25 DIAGNOSIS — IMO0002 Reserved for concepts with insufficient information to code with codable children: Secondary | ICD-10-CM | POA: Insufficient documentation

## 2012-10-25 DIAGNOSIS — Z9851 Tubal ligation status: Secondary | ICD-10-CM | POA: Insufficient documentation

## 2012-10-25 LAB — URINALYSIS, ROUTINE W REFLEX MICROSCOPIC
Glucose, UA: NEGATIVE mg/dL
Hgb urine dipstick: NEGATIVE
Leukocytes, UA: NEGATIVE
Specific Gravity, Urine: 1.02 (ref 1.005–1.030)
pH: 6 (ref 5.0–8.0)

## 2012-10-25 LAB — POCT PREGNANCY, URINE: Preg Test, Ur: NEGATIVE

## 2012-10-25 MED ORDER — HYDROCODONE-ACETAMINOPHEN 5-325 MG PO TABS: 1.0000 | ORAL_TABLET | ORAL | Status: DC | PRN

## 2012-10-25 MED ORDER — OXYCODONE-ACETAMINOPHEN 5-325 MG PO TABS
1.0000 | ORAL_TABLET | Freq: Once | ORAL | Status: AC
  Administered 2012-10-25: 1 via ORAL
  Filled 2012-10-25: qty 1

## 2012-10-25 NOTE — ED Notes (Signed)
Patient transported to X-ray 

## 2012-10-25 NOTE — ED Notes (Signed)
Back from xray
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
## 2012-10-25 NOTE — ED Notes (Addendum)
House address: [address]    [place], [place] [address] House # [phone] Ex boyfriends name: Elnora Morrison Poindexter People in home: Renita Papa, Trent Poindexter, Aviva Wolfer, Sharyn Blitz
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
## 2012-11-10 ENCOUNTER — Encounter (HOSPITAL_COMMUNITY): Payer: Self-pay | Admitting: *Deleted

## 2012-11-10 ENCOUNTER — Emergency Department (HOSPITAL_COMMUNITY)
Admission: EM | Admit: 2012-11-10 | Discharge: 2012-11-10 | Disposition: A | Discharge status: Home / Self Care | Payer: Medicaid Other | Attending: Emergency Medicine | Admitting: Emergency Medicine

## 2012-11-10 DIAGNOSIS — Z3202 Encounter for pregnancy test, result negative: Secondary | ICD-10-CM | POA: Insufficient documentation

## 2012-11-10 DIAGNOSIS — Z9089 Acquired absence of other organs: Secondary | ICD-10-CM | POA: Insufficient documentation

## 2012-11-10 DIAGNOSIS — N390 Urinary tract infection, site not specified: Secondary | ICD-10-CM | POA: Insufficient documentation

## 2012-11-10 DIAGNOSIS — F17200 Nicotine dependence, unspecified, uncomplicated: Secondary | ICD-10-CM | POA: Insufficient documentation

## 2012-11-10 DIAGNOSIS — R110 Nausea: Secondary | ICD-10-CM | POA: Insufficient documentation

## 2012-11-10 DIAGNOSIS — Z8739 Personal history of other diseases of the musculoskeletal system and connective tissue: Secondary | ICD-10-CM | POA: Insufficient documentation

## 2012-11-10 DIAGNOSIS — F3289 Other specified depressive episodes: Secondary | ICD-10-CM | POA: Insufficient documentation

## 2012-11-10 DIAGNOSIS — J45909 Unspecified asthma, uncomplicated: Secondary | ICD-10-CM | POA: Insufficient documentation

## 2012-11-10 DIAGNOSIS — Z8669 Personal history of other diseases of the nervous system and sense organs: Secondary | ICD-10-CM | POA: Insufficient documentation

## 2012-11-10 DIAGNOSIS — Z79899 Other long term (current) drug therapy: Secondary | ICD-10-CM | POA: Insufficient documentation

## 2012-11-10 DIAGNOSIS — Z8719 Personal history of other diseases of the digestive system: Secondary | ICD-10-CM | POA: Insufficient documentation

## 2012-11-10 DIAGNOSIS — F411 Generalized anxiety disorder: Secondary | ICD-10-CM | POA: Insufficient documentation

## 2012-11-10 LAB — PREGNANCY, URINE: Preg Test, Ur: NEGATIVE

## 2012-11-10 LAB — URINE MICROSCOPIC-ADD ON

## 2012-11-10 LAB — URINALYSIS, ROUTINE W REFLEX MICROSCOPIC
Leukocytes, UA: NEGATIVE
Nitrite: POSITIVE — AB
Specific Gravity, Urine: >1.03 — ABNORMAL HIGH (ref 1.005–1.030)
Urobilinogen, UA: 0.2 mg/dL (ref 0.0–1.0)
pH: 5.5 (ref 5.0–8.0)

## 2012-11-10 MED ORDER — CEPHALEXIN 500 MG PO CAPS: 500.0000 mg | ORAL_CAPSULE | Freq: Four times a day (QID) | ORAL | Status: DC

## 2012-11-10 MED ORDER — CEPHALEXIN 500 MG PO CAPS
500.0000 mg | ORAL_CAPSULE | Freq: Once | ORAL | Status: AC
  Administered 2012-11-10: 500 mg via ORAL
  Filled 2012-11-10: qty 1
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
## 2012-11-10 NOTE — ED Notes (Signed)
Pt reporting pain in RLQ, radiating into lower back.  Reports pain is constant.  Also reporting dark urine.

## 2012-11-10 NOTE — ED Notes (Signed)
abd pain, urine dark,  No NVD, fever.

## 2012-11-12 LAB — URINE CULTURE: Colony Count: >=100000

## 2012-11-13 ENCOUNTER — Telehealth (HOSPITAL_COMMUNITY): Payer: Self-pay | Admitting: Emergency Medicine

## 2012-11-13 NOTE — ED Notes (Signed)
Positive urnc- treated with Keflex- sensitive to same- no further follow up at this time.

## 2012-12-18 ENCOUNTER — Emergency Department (HOSPITAL_COMMUNITY): Payer: Self-pay

## 2012-12-18 ENCOUNTER — Encounter (HOSPITAL_COMMUNITY): Payer: Self-pay | Admitting: *Deleted

## 2012-12-18 ENCOUNTER — Emergency Department (HOSPITAL_COMMUNITY)
Admission: EM | Admit: 2012-12-18 | Discharge: 2012-12-18 | Disposition: A | Discharge status: Home / Self Care | Payer: Self-pay | Attending: Emergency Medicine | Admitting: Emergency Medicine

## 2012-12-18 DIAGNOSIS — Z87828 Personal history of other (healed) physical injury and trauma: Secondary | ICD-10-CM | POA: Insufficient documentation

## 2012-12-18 DIAGNOSIS — S63509A Unspecified sprain of unspecified wrist, initial encounter: Secondary | ICD-10-CM | POA: Insufficient documentation

## 2012-12-18 DIAGNOSIS — G8929 Other chronic pain: Secondary | ICD-10-CM | POA: Insufficient documentation

## 2012-12-18 DIAGNOSIS — F17200 Nicotine dependence, unspecified, uncomplicated: Secondary | ICD-10-CM | POA: Insufficient documentation

## 2012-12-18 DIAGNOSIS — Y9389 Activity, other specified: Secondary | ICD-10-CM | POA: Insufficient documentation

## 2012-12-18 DIAGNOSIS — Z8739 Personal history of other diseases of the musculoskeletal system and connective tissue: Secondary | ICD-10-CM | POA: Insufficient documentation

## 2012-12-18 DIAGNOSIS — F411 Generalized anxiety disorder: Secondary | ICD-10-CM | POA: Insufficient documentation

## 2012-12-18 DIAGNOSIS — J45909 Unspecified asthma, uncomplicated: Secondary | ICD-10-CM | POA: Insufficient documentation

## 2012-12-18 DIAGNOSIS — Z8719 Personal history of other diseases of the digestive system: Secondary | ICD-10-CM | POA: Insufficient documentation

## 2012-12-18 DIAGNOSIS — Y929 Unspecified place or not applicable: Secondary | ICD-10-CM | POA: Insufficient documentation

## 2012-12-18 DIAGNOSIS — X503XXA Overexertion from repetitive movements, initial encounter: Secondary | ICD-10-CM | POA: Insufficient documentation

## 2012-12-18 DIAGNOSIS — S335XXA Sprain of ligaments of lumbar spine, initial encounter: Secondary | ICD-10-CM

## 2012-12-18 DIAGNOSIS — Z8669 Personal history of other diseases of the nervous system and sense organs: Secondary | ICD-10-CM | POA: Insufficient documentation

## 2012-12-18 DIAGNOSIS — F3289 Other specified depressive episodes: Secondary | ICD-10-CM | POA: Insufficient documentation

## 2012-12-18 DIAGNOSIS — F329 Major depressive disorder, single episode, unspecified: Secondary | ICD-10-CM | POA: Insufficient documentation

## 2012-12-18 DIAGNOSIS — Z79899 Other long term (current) drug therapy: Secondary | ICD-10-CM | POA: Insufficient documentation

## 2012-12-18 DIAGNOSIS — Z8709 Personal history of other diseases of the respiratory system: Secondary | ICD-10-CM | POA: Insufficient documentation

## 2012-12-18 MED ORDER — HYDROCODONE-ACETAMINOPHEN 5-325 MG PO TABS
1.0000 | ORAL_TABLET | Freq: Once | ORAL | Status: AC
  Administered 2012-12-18: 1 via ORAL
  Filled 2012-12-18: qty 1

## 2012-12-18 MED ORDER — HYDROCODONE-ACETAMINOPHEN 5-325 MG PO TABS: ORAL_TABLET | ORAL | Status: DC

## 2012-12-18 NOTE — ED Notes (Signed)
Noted bruising and swelling to right wrist. Patient states "the pain shoots from my wrist up my arm." Also complaining of lower back pain. Patient states she fell 2 days ago and the pain has worsened.
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
## 2012-12-18 NOTE — ED Notes (Addendum)
Pain rt wrist, and back, after fall.  Contusion  To rt wrist,

## 2012-12-29 NOTE — ED Provider Notes (Signed)
Medical screening examination/treatment/procedure(s) were performed by non-physician practitioner and as supervising physician I was immediately available for consultation/collaboration.  Hurman Horn, MD 12/29/12 [id number]

## 2013-01-15 ENCOUNTER — Emergency Department (HOSPITAL_COMMUNITY)
Admission: EM | Admit: 2013-01-15 | Discharge: 2013-01-15 | Disposition: A | Discharge status: Home / Self Care | Payer: Self-pay | Attending: Emergency Medicine | Admitting: Emergency Medicine

## 2013-01-15 ENCOUNTER — Encounter (HOSPITAL_COMMUNITY): Payer: Self-pay | Admitting: *Deleted

## 2013-01-15 DIAGNOSIS — F329 Major depressive disorder, single episode, unspecified: Secondary | ICD-10-CM | POA: Insufficient documentation

## 2013-01-15 DIAGNOSIS — G8929 Other chronic pain: Secondary | ICD-10-CM | POA: Insufficient documentation

## 2013-01-15 DIAGNOSIS — F3289 Other specified depressive episodes: Secondary | ICD-10-CM | POA: Insufficient documentation

## 2013-01-15 DIAGNOSIS — M545 Low back pain, unspecified: Secondary | ICD-10-CM | POA: Insufficient documentation

## 2013-01-15 DIAGNOSIS — Z8669 Personal history of other diseases of the nervous system and sense organs: Secondary | ICD-10-CM | POA: Insufficient documentation

## 2013-01-15 DIAGNOSIS — Z8739 Personal history of other diseases of the musculoskeletal system and connective tissue: Secondary | ICD-10-CM | POA: Insufficient documentation

## 2013-01-15 DIAGNOSIS — J45909 Unspecified asthma, uncomplicated: Secondary | ICD-10-CM | POA: Insufficient documentation

## 2013-01-15 DIAGNOSIS — Z8719 Personal history of other diseases of the digestive system: Secondary | ICD-10-CM | POA: Insufficient documentation

## 2013-01-15 DIAGNOSIS — F411 Generalized anxiety disorder: Secondary | ICD-10-CM | POA: Insufficient documentation

## 2013-01-15 DIAGNOSIS — F17200 Nicotine dependence, unspecified, uncomplicated: Secondary | ICD-10-CM | POA: Insufficient documentation

## 2013-01-15 MED ORDER — KETOROLAC TROMETHAMINE 60 MG/2ML IM SOLN
60.0000 mg | Freq: Once | INTRAMUSCULAR | Status: AC
  Administered 2013-01-15: 60 mg via INTRAMUSCULAR
  Filled 2013-01-15: qty 2

## 2013-01-15 MED ORDER — PREDNISONE 20 MG PO TABS: ORAL_TABLET | ORAL | Status: DC

## 2013-01-15 MED ORDER — METHOCARBAMOL 500 MG PO TABS
1000.0000 mg | ORAL_TABLET | Freq: Once | ORAL | Status: AC
  Administered 2013-01-15: 1000 mg via ORAL
  Filled 2013-01-15: qty 2

## 2013-01-15 MED ORDER — METHOCARBAMOL 750 MG PO TABS: ORAL_TABLET | ORAL | Status: DC
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
## 2013-01-15 NOTE — ED Notes (Signed)
Low back pain for 2 days,

## 2013-01-15 NOTE — ED Notes (Signed)
Pt received d/c instructions, no further questions, gait steady

## 2013-01-15 NOTE — Discharge Instructions (Signed)
You need to get a doctor to manage your chronic back pain. Take the medications as prescribed. Try ice packs and heat to see which makes your pain feel better.   Chronic Pain Discharge Instructions  Emergency care providers appreciate that many patients coming to [place] are in severe pain and we wish to address their pain in the safest, most responsible manner.  It is important to recognize however, that the proper treatment of chronic pain differs from that of the pain of injuries and acute illnesses.  Our goal is to provide quality, safe, personalized care and we thank you for giving [place] the opportunity to serve you. The use of narcotics and related agents for chronic pain syndromes may lead to additional physical and psychological problems.  Nearly as many people die from prescription narcotics each year as die from car crashes.  Additionally, this risk is increased if such prescriptions are obtained from a variety of sources.  Therefore, only your primary care physician or a pain management specialist is able to safely treat such syndromes with narcotic medications long-term.    Documentation revealing such prescriptions have been sought from multiple sources may prohibit [place] from providing a refill or different narcotic medication.  Your name may be checked first through the [facility] Controlled Substances Reporting System.  This database is a record of controlled substance medication prescriptions that the patient has received.  This has been established by [facility] in an effort to eliminate the dangerous, and often life threatening, practice of obtaining multiple prescriptions from different medical providers.   If you have a chronic pain syndrome (i.e. chronic headaches, recurrent back or neck pain, dental pain, abdominal or pelvis pain without a specific diagnosis, or neuropathic pain such as fibromyalgia) or recurrent visits for the same condition without an acute diagnosis, you may be treated  with non-narcotics and other non-addictive medicines.  Allergic reactions or negative side effects that may be reported by a patient to such medications will not typically lead to the use of a narcotic analgesic or other controlled substance as an alternative.   Patients managing chronic pain with a personal physician should have provisions in place for breakthrough pain.  If you are in crisis, you should call your physician.  If your physician directs you to the emergency department, please have the doctor call and speak to our attending physician concerning your care.   When patients come to the Emergency Department (ED) with acute medical conditions in which the Emergency Department physician feels appropriate to prescribe narcotic or sedating pain medication, the physician will prescribe these in very limited quantities.  The amount of these medications will last only until you can see your primary care physician in his/her office.  Any patient who returns to the ED seeking refills should expect only non-narcotic pain medications.   In the event of an acute medical condition exists and the emergency physician feels it is necessary that the patient be given a narcotic or sedating medication -  a responsible adult driver should be present in the room prior to the medication being given by the nurse.   Prescriptions for narcotic or sedating medications that have been lost, stolen or expired will not be refilled in the Emergency Department.    Patients who have chronic pain may receive non-narcotic prescriptions until seen by their primary care physician.  It is every patients personal responsibility to maintain active prescriptions with his or her primary care physician or specialist.

## 2013-01-31 ENCOUNTER — Emergency Department (HOSPITAL_COMMUNITY)
Admission: EM | Admit: 2013-01-31 | Discharge: 2013-01-31 | Disposition: A | Discharge status: Home / Self Care | Payer: Self-pay | Attending: Emergency Medicine | Admitting: Emergency Medicine

## 2013-01-31 ENCOUNTER — Encounter (HOSPITAL_COMMUNITY): Payer: Self-pay | Admitting: Emergency Medicine

## 2013-01-31 DIAGNOSIS — M549 Dorsalgia, unspecified: Secondary | ICD-10-CM | POA: Insufficient documentation

## 2013-01-31 DIAGNOSIS — F3289 Other specified depressive episodes: Secondary | ICD-10-CM | POA: Insufficient documentation

## 2013-01-31 DIAGNOSIS — F411 Generalized anxiety disorder: Secondary | ICD-10-CM | POA: Insufficient documentation

## 2013-01-31 DIAGNOSIS — S025XXA Fracture of tooth (traumatic), initial encounter for closed fracture: Secondary | ICD-10-CM

## 2013-01-31 DIAGNOSIS — G8929 Other chronic pain: Secondary | ICD-10-CM | POA: Insufficient documentation

## 2013-01-31 DIAGNOSIS — J45901 Unspecified asthma with (acute) exacerbation: Secondary | ICD-10-CM | POA: Insufficient documentation

## 2013-01-31 DIAGNOSIS — Z8719 Personal history of other diseases of the digestive system: Secondary | ICD-10-CM | POA: Insufficient documentation

## 2013-01-31 DIAGNOSIS — Z79899 Other long term (current) drug therapy: Secondary | ICD-10-CM | POA: Insufficient documentation

## 2013-01-31 DIAGNOSIS — K029 Dental caries, unspecified: Secondary | ICD-10-CM | POA: Insufficient documentation

## 2013-01-31 DIAGNOSIS — F329 Major depressive disorder, single episode, unspecified: Secondary | ICD-10-CM | POA: Insufficient documentation

## 2013-01-31 DIAGNOSIS — K0381 Cracked tooth: Secondary | ICD-10-CM | POA: Insufficient documentation

## 2013-01-31 DIAGNOSIS — Z8669 Personal history of other diseases of the nervous system and sense organs: Secondary | ICD-10-CM | POA: Insufficient documentation

## 2013-01-31 DIAGNOSIS — Z8739 Personal history of other diseases of the musculoskeletal system and connective tissue: Secondary | ICD-10-CM | POA: Insufficient documentation

## 2013-01-31 DIAGNOSIS — F17200 Nicotine dependence, unspecified, uncomplicated: Secondary | ICD-10-CM | POA: Insufficient documentation

## 2013-01-31 MED ORDER — HYDROCODONE-ACETAMINOPHEN 5-325 MG PO TABS: 1.0000 | ORAL_TABLET | ORAL | Status: DC | PRN

## 2013-01-31 NOTE — ED Provider Notes (Signed)
Medical screening examination/treatment/procedure(s) were performed by non-physician practitioner and as supervising physician I was immediately available for consultation/collaboration.  Geoffery Lyons, MD 01/31/13 [phone]

## 2013-01-31 NOTE — ED Notes (Signed)
Pt c/o left side dental pain since last night. Pt states she "eating dinner when my tooth cracked".
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
## 2013-03-12 ENCOUNTER — Encounter (HOSPITAL_COMMUNITY): Payer: Self-pay | Admitting: *Deleted

## 2013-03-12 ENCOUNTER — Emergency Department (HOSPITAL_COMMUNITY)
Admission: EM | Admit: 2013-03-12 | Discharge: 2013-03-12 | Disposition: A | Discharge status: Home / Self Care | Payer: Self-pay | Attending: Emergency Medicine | Admitting: Emergency Medicine

## 2013-03-12 DIAGNOSIS — F3289 Other specified depressive episodes: Secondary | ICD-10-CM | POA: Insufficient documentation

## 2013-03-12 DIAGNOSIS — Z8669 Personal history of other diseases of the nervous system and sense organs: Secondary | ICD-10-CM | POA: Insufficient documentation

## 2013-03-12 DIAGNOSIS — Z8719 Personal history of other diseases of the digestive system: Secondary | ICD-10-CM | POA: Insufficient documentation

## 2013-03-12 DIAGNOSIS — G8929 Other chronic pain: Secondary | ICD-10-CM | POA: Insufficient documentation

## 2013-03-12 DIAGNOSIS — K08109 Complete loss of teeth, unspecified cause, unspecified class: Secondary | ICD-10-CM | POA: Insufficient documentation

## 2013-03-12 DIAGNOSIS — F17200 Nicotine dependence, unspecified, uncomplicated: Secondary | ICD-10-CM | POA: Insufficient documentation

## 2013-03-12 DIAGNOSIS — K029 Dental caries, unspecified: Secondary | ICD-10-CM | POA: Insufficient documentation

## 2013-03-12 DIAGNOSIS — K089 Disorder of teeth and supporting structures, unspecified: Secondary | ICD-10-CM | POA: Insufficient documentation

## 2013-03-12 DIAGNOSIS — F329 Major depressive disorder, single episode, unspecified: Secondary | ICD-10-CM | POA: Insufficient documentation

## 2013-03-12 DIAGNOSIS — F411 Generalized anxiety disorder: Secondary | ICD-10-CM | POA: Insufficient documentation

## 2013-03-12 DIAGNOSIS — Z8739 Personal history of other diseases of the musculoskeletal system and connective tissue: Secondary | ICD-10-CM | POA: Insufficient documentation

## 2013-03-12 DIAGNOSIS — Z79899 Other long term (current) drug therapy: Secondary | ICD-10-CM | POA: Insufficient documentation

## 2013-03-12 DIAGNOSIS — J45909 Unspecified asthma, uncomplicated: Secondary | ICD-10-CM | POA: Insufficient documentation

## 2013-03-12 MED ORDER — AMOXICILLIN 250 MG PO CAPS: 250.0000 mg | ORAL_CAPSULE | Freq: Three times a day (TID) | ORAL | Status: DC

## 2013-03-12 MED ORDER — HYDROCODONE-ACETAMINOPHEN 5-325 MG PO TABS
1.0000 | ORAL_TABLET | Freq: Once | ORAL | Status: AC
  Administered 2013-03-12: 1 via ORAL
  Filled 2013-03-12: qty 1

## 2013-03-12 MED ORDER — HYDROCODONE-ACETAMINOPHEN 5-325 MG PO TABS: 1.0000 | ORAL_TABLET | ORAL | Status: DC | PRN

## 2013-03-12 MED ORDER — AMOXICILLIN 250 MG PO CAPS
250.0000 mg | ORAL_CAPSULE | Freq: Once | ORAL | Status: AC
  Administered 2013-03-12: 250 mg via ORAL
  Filled 2013-03-12: qty 1

## 2013-03-12 NOTE — ED Notes (Signed)
Pt co broken tooth, upper back lt side. Pt noticed pain last night attempted to see dentist today, unsuccessful.

## 2013-03-12 NOTE — ED Provider Notes (Signed)
Medical screening examination/treatment/procedure(s) were performed by non-physician practitioner and as supervising physician I was immediately available for consultation/collaboration. Ishanvi Mcquitty, MD, FACEP   Randol Zumstein L Korra Christine, MD 03/12/13 2327 
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
## 2013-03-30 ENCOUNTER — Encounter (HOSPITAL_COMMUNITY): Payer: Self-pay | Admitting: *Deleted

## 2013-03-30 ENCOUNTER — Emergency Department (HOSPITAL_COMMUNITY)
Admission: EM | Admit: 2013-03-30 | Discharge: 2013-03-30 | Disposition: A | Discharge status: Home / Self Care | Payer: Self-pay | Attending: Emergency Medicine | Admitting: Emergency Medicine

## 2013-03-30 DIAGNOSIS — F3289 Other specified depressive episodes: Secondary | ICD-10-CM | POA: Insufficient documentation

## 2013-03-30 DIAGNOSIS — R52 Pain, unspecified: Secondary | ICD-10-CM | POA: Insufficient documentation

## 2013-03-30 DIAGNOSIS — F17200 Nicotine dependence, unspecified, uncomplicated: Secondary | ICD-10-CM | POA: Insufficient documentation

## 2013-03-30 DIAGNOSIS — S39012A Strain of muscle, fascia and tendon of lower back, initial encounter: Secondary | ICD-10-CM

## 2013-03-30 DIAGNOSIS — F329 Major depressive disorder, single episode, unspecified: Secondary | ICD-10-CM | POA: Insufficient documentation

## 2013-03-30 DIAGNOSIS — J45909 Unspecified asthma, uncomplicated: Secondary | ICD-10-CM | POA: Insufficient documentation

## 2013-03-30 DIAGNOSIS — Z8739 Personal history of other diseases of the musculoskeletal system and connective tissue: Secondary | ICD-10-CM | POA: Insufficient documentation

## 2013-03-30 DIAGNOSIS — Y929 Unspecified place or not applicable: Secondary | ICD-10-CM | POA: Insufficient documentation

## 2013-03-30 DIAGNOSIS — F410 Panic disorder [episodic paroxysmal anxiety] without agoraphobia: Secondary | ICD-10-CM | POA: Insufficient documentation

## 2013-03-30 DIAGNOSIS — Z8719 Personal history of other diseases of the digestive system: Secondary | ICD-10-CM | POA: Insufficient documentation

## 2013-03-30 DIAGNOSIS — G8929 Other chronic pain: Secondary | ICD-10-CM | POA: Insufficient documentation

## 2013-03-30 DIAGNOSIS — M62830 Muscle spasm of back: Secondary | ICD-10-CM

## 2013-03-30 DIAGNOSIS — S335XXA Sprain of ligaments of lumbar spine, initial encounter: Secondary | ICD-10-CM | POA: Insufficient documentation

## 2013-03-30 DIAGNOSIS — Z79899 Other long term (current) drug therapy: Secondary | ICD-10-CM | POA: Insufficient documentation

## 2013-03-30 DIAGNOSIS — X58XXXA Exposure to other specified factors, initial encounter: Secondary | ICD-10-CM | POA: Insufficient documentation

## 2013-03-30 DIAGNOSIS — Y939 Activity, unspecified: Secondary | ICD-10-CM | POA: Insufficient documentation

## 2013-03-30 DIAGNOSIS — G40909 Epilepsy, unspecified, not intractable, without status epilepticus: Secondary | ICD-10-CM | POA: Insufficient documentation

## 2013-03-30 MED ORDER — HYDROCODONE-ACETAMINOPHEN 5-325 MG PO TABS: 1.0000 | ORAL_TABLET | ORAL | Status: DC | PRN

## 2013-03-30 MED ORDER — DIAZEPAM 5 MG PO TABS: 5.0000 mg | ORAL_TABLET | Freq: Three times a day (TID) | ORAL | Status: DC | PRN

## 2013-03-30 MED ORDER — HYDROCODONE-ACETAMINOPHEN 5-325 MG PO TABS
1.0000 | ORAL_TABLET | Freq: Once | ORAL | Status: AC
  Administered 2013-03-30: 1 via ORAL
  Filled 2013-03-30: qty 1

## 2013-03-30 NOTE — ED Notes (Signed)
Pain low back , mostly on left side since yesterday, No known injury
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
## 2013-04-04 NOTE — ED Provider Notes (Signed)
Medical screening examination/treatment/procedure(s) were performed by non-physician practitioner and as supervising physician I was immediately available for consultation/collaboration.   Sergio Hobart L Mohogany Toppins, MD 04/04/13 0935 

## 2013-04-05 ENCOUNTER — Emergency Department (HOSPITAL_COMMUNITY): Payer: Self-pay

## 2013-04-05 ENCOUNTER — Emergency Department (HOSPITAL_COMMUNITY)
Admission: EM | Admit: 2013-04-05 | Discharge: 2013-04-05 | Disposition: A | Discharge status: Home / Self Care | Payer: Self-pay | Attending: Emergency Medicine | Admitting: Emergency Medicine

## 2013-04-05 ENCOUNTER — Encounter (HOSPITAL_COMMUNITY): Payer: Self-pay | Admitting: Emergency Medicine

## 2013-04-05 DIAGNOSIS — Y9229 Other specified public building as the place of occurrence of the external cause: Secondary | ICD-10-CM | POA: Insufficient documentation

## 2013-04-05 DIAGNOSIS — Y939 Activity, unspecified: Secondary | ICD-10-CM | POA: Insufficient documentation

## 2013-04-05 DIAGNOSIS — F3289 Other specified depressive episodes: Secondary | ICD-10-CM | POA: Insufficient documentation

## 2013-04-05 DIAGNOSIS — J45909 Unspecified asthma, uncomplicated: Secondary | ICD-10-CM | POA: Insufficient documentation

## 2013-04-05 DIAGNOSIS — X500XXA Overexertion from strenuous movement or load, initial encounter: Secondary | ICD-10-CM | POA: Insufficient documentation

## 2013-04-05 DIAGNOSIS — Z79899 Other long term (current) drug therapy: Secondary | ICD-10-CM | POA: Insufficient documentation

## 2013-04-05 DIAGNOSIS — F17200 Nicotine dependence, unspecified, uncomplicated: Secondary | ICD-10-CM | POA: Insufficient documentation

## 2013-04-05 DIAGNOSIS — S93401A Sprain of unspecified ligament of right ankle, initial encounter: Secondary | ICD-10-CM

## 2013-04-05 DIAGNOSIS — F411 Generalized anxiety disorder: Secondary | ICD-10-CM | POA: Insufficient documentation

## 2013-04-05 DIAGNOSIS — F329 Major depressive disorder, single episode, unspecified: Secondary | ICD-10-CM | POA: Insufficient documentation

## 2013-04-05 DIAGNOSIS — Z8739 Personal history of other diseases of the musculoskeletal system and connective tissue: Secondary | ICD-10-CM | POA: Insufficient documentation

## 2013-04-05 DIAGNOSIS — Z8719 Personal history of other diseases of the digestive system: Secondary | ICD-10-CM | POA: Insufficient documentation

## 2013-04-05 DIAGNOSIS — S93409A Sprain of unspecified ligament of unspecified ankle, initial encounter: Secondary | ICD-10-CM | POA: Insufficient documentation

## 2013-04-05 MED ORDER — OXYCODONE-ACETAMINOPHEN 5-325 MG PO TABS: 1.0000 | ORAL_TABLET | ORAL | Status: DC | PRN

## 2013-04-05 MED ORDER — OXYCODONE-ACETAMINOPHEN 5-325 MG PO TABS
1.0000 | ORAL_TABLET | Freq: Once | ORAL | Status: AC
  Administered 2013-04-05: 1 via ORAL
  Filled 2013-04-05: qty 1
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
## 2013-04-05 NOTE — ED Notes (Signed)
Pt requesting nausea and pain meds.

## 2013-04-05 NOTE — ED Notes (Signed)
Patient states she tripped over a pallet at [facility] and twisted her right ankle.

## 2013-04-06 MED FILL — Oxycodone w/ Acetaminophen Tab 5-325 MG: ORAL | Qty: 6 | Status: AC

## 2013-04-15 ENCOUNTER — Emergency Department (HOSPITAL_COMMUNITY): Payer: Self-pay

## 2013-04-15 ENCOUNTER — Emergency Department (HOSPITAL_COMMUNITY)
Admission: EM | Admit: 2013-04-15 | Discharge: 2013-04-15 | Disposition: A | Discharge status: Home / Self Care | Payer: Self-pay | Attending: Emergency Medicine | Admitting: Emergency Medicine

## 2013-04-15 ENCOUNTER — Encounter (HOSPITAL_COMMUNITY): Payer: Self-pay | Admitting: Emergency Medicine

## 2013-04-15 DIAGNOSIS — F329 Major depressive disorder, single episode, unspecified: Secondary | ICD-10-CM | POA: Insufficient documentation

## 2013-04-15 DIAGNOSIS — J45909 Unspecified asthma, uncomplicated: Secondary | ICD-10-CM | POA: Insufficient documentation

## 2013-04-15 DIAGNOSIS — Z8739 Personal history of other diseases of the musculoskeletal system and connective tissue: Secondary | ICD-10-CM | POA: Insufficient documentation

## 2013-04-15 DIAGNOSIS — Z8719 Personal history of other diseases of the digestive system: Secondary | ICD-10-CM | POA: Insufficient documentation

## 2013-04-15 DIAGNOSIS — B070 Plantar wart: Secondary | ICD-10-CM | POA: Insufficient documentation

## 2013-04-15 DIAGNOSIS — F3289 Other specified depressive episodes: Secondary | ICD-10-CM | POA: Insufficient documentation

## 2013-04-15 DIAGNOSIS — G8929 Other chronic pain: Secondary | ICD-10-CM | POA: Insufficient documentation

## 2013-04-15 DIAGNOSIS — F17200 Nicotine dependence, unspecified, uncomplicated: Secondary | ICD-10-CM | POA: Insufficient documentation

## 2013-04-15 DIAGNOSIS — F411 Generalized anxiety disorder: Secondary | ICD-10-CM | POA: Insufficient documentation

## 2013-04-15 MED ORDER — HYDROCODONE-ACETAMINOPHEN 5-325 MG PO TABS: 1.0000 | ORAL_TABLET | ORAL | Status: DC | PRN

## 2013-04-15 MED ORDER — IBUPROFEN 600 MG PO TABS: 600.0000 mg | ORAL_TABLET | Freq: Four times a day (QID) | ORAL | Status: DC | PRN

## 2013-04-15 NOTE — ED Notes (Signed)
Pt c/o knot to bottom of right foot x 3-4 days ago. Pain. Nad.

## 2013-04-15 NOTE — ED Notes (Signed)
Pt seen and evaluated by EDPa for initial assessment. 
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
## 2013-04-21 NOTE — ED Provider Notes (Signed)
Medical screening examination/treatment/procedure(s) were performed by non-physician practitioner and as supervising physician I was immediately available for consultation/collaboration.    Christopher J. Pollina, MD 04/21/13 0706 

## 2013-06-05 ENCOUNTER — Emergency Department (HOSPITAL_COMMUNITY)
Admission: EM | Admit: 2013-06-05 | Discharge: 2013-06-05 | Disposition: A | Discharge status: Home / Self Care | Payer: Self-pay | Attending: Emergency Medicine | Admitting: Emergency Medicine

## 2013-06-05 ENCOUNTER — Encounter (HOSPITAL_COMMUNITY): Payer: Self-pay | Admitting: *Deleted

## 2013-06-05 DIAGNOSIS — G8929 Other chronic pain: Secondary | ICD-10-CM | POA: Insufficient documentation

## 2013-06-05 DIAGNOSIS — J45909 Unspecified asthma, uncomplicated: Secondary | ICD-10-CM | POA: Insufficient documentation

## 2013-06-05 DIAGNOSIS — R51 Headache: Secondary | ICD-10-CM | POA: Insufficient documentation

## 2013-06-05 DIAGNOSIS — F411 Generalized anxiety disorder: Secondary | ICD-10-CM | POA: Insufficient documentation

## 2013-06-05 DIAGNOSIS — Z79899 Other long term (current) drug therapy: Secondary | ICD-10-CM | POA: Insufficient documentation

## 2013-06-05 DIAGNOSIS — K089 Disorder of teeth and supporting structures, unspecified: Secondary | ICD-10-CM | POA: Insufficient documentation

## 2013-06-05 DIAGNOSIS — Z8739 Personal history of other diseases of the musculoskeletal system and connective tissue: Secondary | ICD-10-CM | POA: Insufficient documentation

## 2013-06-05 DIAGNOSIS — F329 Major depressive disorder, single episode, unspecified: Secondary | ICD-10-CM | POA: Insufficient documentation

## 2013-06-05 DIAGNOSIS — G40909 Epilepsy, unspecified, not intractable, without status epilepticus: Secondary | ICD-10-CM | POA: Insufficient documentation

## 2013-06-05 DIAGNOSIS — K0889 Other specified disorders of teeth and supporting structures: Secondary | ICD-10-CM

## 2013-06-05 DIAGNOSIS — F3289 Other specified depressive episodes: Secondary | ICD-10-CM | POA: Insufficient documentation

## 2013-06-05 DIAGNOSIS — F17200 Nicotine dependence, unspecified, uncomplicated: Secondary | ICD-10-CM | POA: Insufficient documentation

## 2013-06-05 DIAGNOSIS — Z8719 Personal history of other diseases of the digestive system: Secondary | ICD-10-CM | POA: Insufficient documentation

## 2013-06-05 MED ORDER — AMOXICILLIN 500 MG PO CAPS: 500.0000 mg | ORAL_CAPSULE | Freq: Three times a day (TID) | ORAL | Status: DC

## 2013-06-05 MED ORDER — IBUPROFEN 800 MG PO TABS
800.0000 mg | ORAL_TABLET | Freq: Once | ORAL | Status: AC
  Administered 2013-06-05: 800 mg via ORAL
  Filled 2013-06-05: qty 1

## 2013-06-05 MED ORDER — IBUPROFEN 800 MG PO TABS: 800.0000 mg | ORAL_TABLET | Freq: Four times a day (QID) | ORAL | Status: DC | PRN

## 2013-06-05 NOTE — ED Provider Notes (Signed)
Medical screening examination/treatment/procedure(s) were performed by non-physician practitioner and as supervising physician I was immediately available for consultation/collaboration.  Donnetta Hutching, MD 06/05/13 1840
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
## 2013-06-05 NOTE — ED Notes (Signed)
Dental pain, tooth broke Mandibular molar left

## 2013-06-27 ENCOUNTER — Encounter (HOSPITAL_COMMUNITY): Payer: Self-pay | Admitting: Emergency Medicine

## 2013-06-27 ENCOUNTER — Emergency Department (HOSPITAL_COMMUNITY)
Admission: EM | Admit: 2013-06-27 | Discharge: 2013-06-28 | Disposition: A | Discharge status: Home / Self Care | Payer: Self-pay | Attending: Emergency Medicine | Admitting: Emergency Medicine

## 2013-06-27 DIAGNOSIS — S61219A Laceration without foreign body of unspecified finger without damage to nail, initial encounter: Secondary | ICD-10-CM

## 2013-06-27 DIAGNOSIS — Z8739 Personal history of other diseases of the musculoskeletal system and connective tissue: Secondary | ICD-10-CM | POA: Insufficient documentation

## 2013-06-27 DIAGNOSIS — W260XXA Contact with knife, initial encounter: Secondary | ICD-10-CM | POA: Insufficient documentation

## 2013-06-27 DIAGNOSIS — F3289 Other specified depressive episodes: Secondary | ICD-10-CM | POA: Insufficient documentation

## 2013-06-27 DIAGNOSIS — J45909 Unspecified asthma, uncomplicated: Secondary | ICD-10-CM | POA: Insufficient documentation

## 2013-06-27 DIAGNOSIS — F411 Generalized anxiety disorder: Secondary | ICD-10-CM | POA: Insufficient documentation

## 2013-06-27 DIAGNOSIS — Y929 Unspecified place or not applicable: Secondary | ICD-10-CM | POA: Insufficient documentation

## 2013-06-27 DIAGNOSIS — F329 Major depressive disorder, single episode, unspecified: Secondary | ICD-10-CM | POA: Insufficient documentation

## 2013-06-27 DIAGNOSIS — S61409A Unspecified open wound of unspecified hand, initial encounter: Secondary | ICD-10-CM | POA: Insufficient documentation

## 2013-06-27 DIAGNOSIS — F17200 Nicotine dependence, unspecified, uncomplicated: Secondary | ICD-10-CM | POA: Insufficient documentation

## 2013-06-27 DIAGNOSIS — G40909 Epilepsy, unspecified, not intractable, without status epilepticus: Secondary | ICD-10-CM | POA: Insufficient documentation

## 2013-06-27 DIAGNOSIS — S61209A Unspecified open wound of unspecified finger without damage to nail, initial encounter: Secondary | ICD-10-CM | POA: Insufficient documentation

## 2013-06-27 DIAGNOSIS — Y93G1 Activity, food preparation and clean up: Secondary | ICD-10-CM | POA: Insufficient documentation

## 2013-06-27 DIAGNOSIS — Z8719 Personal history of other diseases of the digestive system: Secondary | ICD-10-CM | POA: Insufficient documentation

## 2013-06-27 DIAGNOSIS — G8929 Other chronic pain: Secondary | ICD-10-CM | POA: Insufficient documentation

## 2013-06-27 MED ORDER — LIDOCAINE HCL (PF) 1 % IJ SOLN: 5.0000 mL | Freq: Once | INTRAMUSCULAR | Status: AC

## 2013-06-27 MED ORDER — LIDOCAINE HCL (PF) 1 % IJ SOLN
INTRAMUSCULAR | Status: AC
  Administered 2013-06-28: 5 mL
  Filled 2013-06-27: qty 5
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
## 2013-06-28 MED ORDER — IBUPROFEN 800 MG PO TABS: 800.0000 mg | ORAL_TABLET | Freq: Three times a day (TID) | ORAL | Status: DC

## 2013-06-28 MED ORDER — IBUPROFEN 800 MG PO TABS
800.0000 mg | ORAL_TABLET | Freq: Once | ORAL | Status: AC
  Administered 2013-06-28: 800 mg via ORAL
  Filled 2013-06-28: qty 1

## 2013-06-28 NOTE — ED Provider Notes (Signed)
Medical screening examination/treatment/procedure(s) were performed by non-physician practitioner and as supervising physician I was immediately available for consultation/collaboration.  Sunnie Nielsen, MD 06/28/13 [phone]
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
## 2013-09-06 ENCOUNTER — Emergency Department (HOSPITAL_COMMUNITY)
Admission: EM | Admit: 2013-09-06 | Discharge: 2013-09-06 | Disposition: A | Discharge status: Home / Self Care | Payer: Self-pay | Attending: Emergency Medicine | Admitting: Emergency Medicine

## 2013-09-06 ENCOUNTER — Encounter (HOSPITAL_COMMUNITY): Payer: Self-pay | Admitting: Emergency Medicine

## 2013-09-06 DIAGNOSIS — M5432 Sciatica, left side: Secondary | ICD-10-CM

## 2013-09-06 DIAGNOSIS — J45909 Unspecified asthma, uncomplicated: Secondary | ICD-10-CM | POA: Insufficient documentation

## 2013-09-06 DIAGNOSIS — G8929 Other chronic pain: Secondary | ICD-10-CM | POA: Insufficient documentation

## 2013-09-06 DIAGNOSIS — Z9089 Acquired absence of other organs: Secondary | ICD-10-CM | POA: Insufficient documentation

## 2013-09-06 DIAGNOSIS — M5430 Sciatica, unspecified side: Secondary | ICD-10-CM | POA: Insufficient documentation

## 2013-09-06 DIAGNOSIS — Z79899 Other long term (current) drug therapy: Secondary | ICD-10-CM | POA: Insufficient documentation

## 2013-09-06 DIAGNOSIS — F17200 Nicotine dependence, unspecified, uncomplicated: Secondary | ICD-10-CM | POA: Insufficient documentation

## 2013-09-06 DIAGNOSIS — Z8719 Personal history of other diseases of the digestive system: Secondary | ICD-10-CM | POA: Insufficient documentation

## 2013-09-06 DIAGNOSIS — Z8659 Personal history of other mental and behavioral disorders: Secondary | ICD-10-CM | POA: Insufficient documentation

## 2013-09-06 DIAGNOSIS — Z8669 Personal history of other diseases of the nervous system and sense organs: Secondary | ICD-10-CM | POA: Insufficient documentation

## 2013-09-06 DIAGNOSIS — Z90710 Acquired absence of both cervix and uterus: Secondary | ICD-10-CM | POA: Insufficient documentation

## 2013-09-06 MED ORDER — HYDROCODONE-ACETAMINOPHEN 5-325 MG PO TABS
1.0000 | ORAL_TABLET | Freq: Once | ORAL | Status: AC
  Administered 2013-09-06: 1 via ORAL
  Filled 2013-09-06: qty 1

## 2013-09-06 MED ORDER — PREDNISONE 10 MG PO TABS: ORAL_TABLET | ORAL | Status: DC

## 2013-09-06 MED ORDER — HYDROCODONE-ACETAMINOPHEN 5-325 MG PO TABS: 1.0000 | ORAL_TABLET | ORAL | Status: DC | PRN

## 2013-09-06 MED ORDER — PREDNISONE 50 MG PO TABS
60.0000 mg | ORAL_TABLET | Freq: Once | ORAL | Status: AC
  Administered 2013-09-06: 60 mg via ORAL
  Filled 2013-09-06 (×2): qty 1

## 2013-09-06 MED ORDER — BACLOFEN 10 MG PO TABS: 10.0000 mg | ORAL_TABLET | Freq: Three times a day (TID) | ORAL | Status: DC

## 2013-09-06 NOTE — ED Notes (Signed)
Pt c/o lower back pain since yesterday 
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
## 2013-09-14 NOTE — ED Provider Notes (Signed)
Medical screening examination/treatment/procedure(s) were performed by non-physician practitioner and as supervising physician I was immediately available for consultation/collaboration.  EKG Interpretation   None         Shelda Jakes, MD 09/14/13 [phone]

## 2013-12-29 ENCOUNTER — Emergency Department (HOSPITAL_COMMUNITY)
Admission: EM | Admit: 2013-12-29 | Discharge: 2013-12-29 | Disposition: A | Discharge status: Home / Self Care | Payer: Self-pay | Attending: Emergency Medicine | Admitting: Emergency Medicine

## 2013-12-29 ENCOUNTER — Encounter (HOSPITAL_COMMUNITY): Payer: Self-pay | Admitting: Emergency Medicine

## 2013-12-29 ENCOUNTER — Emergency Department (HOSPITAL_COMMUNITY): Payer: Self-pay

## 2013-12-29 DIAGNOSIS — Z79899 Other long term (current) drug therapy: Secondary | ICD-10-CM | POA: Insufficient documentation

## 2013-12-29 DIAGNOSIS — S39012A Strain of muscle, fascia and tendon of lower back, initial encounter: Secondary | ICD-10-CM

## 2013-12-29 DIAGNOSIS — S335XXA Sprain of ligaments of lumbar spine, initial encounter: Secondary | ICD-10-CM | POA: Insufficient documentation

## 2013-12-29 DIAGNOSIS — J45909 Unspecified asthma, uncomplicated: Secondary | ICD-10-CM | POA: Insufficient documentation

## 2013-12-29 DIAGNOSIS — T07XXXA Unspecified multiple injuries, initial encounter: Secondary | ICD-10-CM | POA: Insufficient documentation

## 2013-12-29 DIAGNOSIS — F411 Generalized anxiety disorder: Secondary | ICD-10-CM | POA: Insufficient documentation

## 2013-12-29 DIAGNOSIS — Z8719 Personal history of other diseases of the digestive system: Secondary | ICD-10-CM | POA: Insufficient documentation

## 2013-12-29 DIAGNOSIS — F17200 Nicotine dependence, unspecified, uncomplicated: Secondary | ICD-10-CM | POA: Insufficient documentation

## 2013-12-29 DIAGNOSIS — G8929 Other chronic pain: Secondary | ICD-10-CM | POA: Insufficient documentation

## 2013-12-29 MED ORDER — DIAZEPAM 5 MG PO TABS: 5.0000 mg | ORAL_TABLET | Freq: Two times a day (BID) | ORAL | Status: DC

## 2013-12-29 MED ORDER — HYDROCODONE-ACETAMINOPHEN 5-325 MG PO TABS: ORAL_TABLET | ORAL | Status: DC

## 2013-12-29 MED ORDER — HYDROCODONE-ACETAMINOPHEN 5-325 MG PO TABS
1.0000 | ORAL_TABLET | Freq: Once | ORAL | Status: AC
  Administered 2013-12-29: 1 via ORAL
  Filled 2013-12-29: qty 1

## 2013-12-29 NOTE — Discharge Instructions (Signed)
Contusion A contusion is a deep bruise. Contusions happen when an injury causes bleeding under the skin. Signs of bruising include pain, puffiness (swelling), and discolored skin. The contusion may turn blue, purple, or yellow. HOME CARE   Put ice on the injured area.  Put ice in a plastic bag.  Place a towel between your skin and the bag.  Leave the ice on for 15-20 minutes, 03-04 times a day.  Only take medicine as told by your doctor.  Rest the injured area.  If possible, raise (elevate) the injured area to lessen puffiness. GET HELP RIGHT AWAY IF:   You have more bruising or puffiness.  You have pain that is getting worse.  Your puffiness or pain is not helped by medicine. MAKE SURE YOU:   Understand these instructions.  Will watch your condition.  Will get help right away if you are not doing well or get worse. Document Released: 02/20/2008 Document Revised: 11/26/2011 Document Reviewed: 07/09/2011 [facility] Patient Information 2014 [place], [place].  Lumbosacral Strain Lumbosacral strain is a strain of any of the parts that make up your lumbosacral vertebrae. Your lumbosacral vertebrae are the bones that make up the lower third of your backbone. Your lumbosacral vertebrae are held together by muscles and tough, fibrous tissue (ligaments).  CAUSES  A sudden blow to your back can cause lumbosacral strain. Also, anything that causes an excessive stretch of the muscles in the low back can cause this strain. This is typically seen when people exert themselves strenuously, fall, lift heavy objects, bend, or crouch repeatedly. RISK FACTORS  Physically demanding work.  Participation in pushing or pulling sports or sports that require sudden twist of the back (tennis, golf, baseball).  Weight lifting.  Excessive lower back curvature.  Forward-tilted pelvis.  Weak back or abdominal muscles or both.  Tight hamstrings. SIGNS AND SYMPTOMS  Lumbosacral strain may cause  pain in the area of your injury or pain that moves (radiates) down your leg.  DIAGNOSIS Your health care provider can often diagnose lumbosacral strain through a physical exam. In some cases, you may need tests such as X-ray exams.  TREATMENT  Treatment for your lower back injury depends on many factors that your clinician will have to evaluate. However, most treatment will include the use of anti-inflammatory medicines. HOME CARE INSTRUCTIONS   Avoid hard physical activities (tennis, racquetball, waterskiing) if you are not in proper physical condition for it. This may aggravate or create problems.  If you have a back problem, avoid sports requiring sudden body movements. Swimming and walking are generally safer activities.  Maintain good posture.  Maintain a healthy weight.  For acute conditions, you may put ice on the injured area.  Put ice in a plastic bag.  Place a towel between your skin and the bag.  Leave the ice on for 20 minutes, 2 3 times a day.  When the low back starts healing, stretching and strengthening exercises may be recommended. SEEK MEDICAL CARE IF:  Your back pain is getting worse.  You experience severe back pain not relieved with medicines. SEEK IMMEDIATE MEDICAL CARE IF:   You have numbness, tingling, weakness, or problems with the use of your arms or legs.  There is a change in bowel or bladder control.  You have increasing pain in any area of the body, including your belly (abdomen).  You notice shortness of breath, dizziness, or feel faint.  You feel sick to your stomach (nauseous), are throwing up (vomiting), or become  sweaty.  You notice discoloration of your toes or legs, or your feet get very cold. MAKE SURE YOU:   Understand these instructions.  Will watch your condition.  Will get help right away if you are not doing well or get worse. Document Released: 06/13/2005 Document Revised: 06/24/2013 Document Reviewed: 04/22/2013 [facility] PLLCExitCare  Patient Information 2014 [place], [place]LL[place].
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
## 2013-12-29 NOTE — ED Notes (Signed)
Alert, talking on phone.  T Triplett PA in to see pt.
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
## 2014-01-03 NOTE — ED Provider Notes (Signed)
Medical screening examination/treatment/procedure(s) were performed by non-physician practitioner and as supervising physician I was immediately available for consultation/collaboration.  Cutberto Winfree M Kashaun Bebo, MD 01/03/14 0152 

## 2014-01-25 ENCOUNTER — Encounter (HOSPITAL_COMMUNITY): Payer: Self-pay | Admitting: Emergency Medicine

## 2014-01-25 ENCOUNTER — Emergency Department (HOSPITAL_COMMUNITY)
Admission: EM | Admit: 2014-01-25 | Discharge: 2014-01-25 | Disposition: A | Discharge status: Home / Self Care | Payer: Self-pay | Attending: Emergency Medicine | Admitting: Emergency Medicine

## 2014-01-25 DIAGNOSIS — F17200 Nicotine dependence, unspecified, uncomplicated: Secondary | ICD-10-CM | POA: Insufficient documentation

## 2014-01-25 DIAGNOSIS — M545 Low back pain, unspecified: Secondary | ICD-10-CM | POA: Insufficient documentation

## 2014-01-25 DIAGNOSIS — F411 Generalized anxiety disorder: Secondary | ICD-10-CM | POA: Insufficient documentation

## 2014-01-25 DIAGNOSIS — Z8719 Personal history of other diseases of the digestive system: Secondary | ICD-10-CM | POA: Insufficient documentation

## 2014-01-25 DIAGNOSIS — M546 Pain in thoracic spine: Secondary | ICD-10-CM | POA: Insufficient documentation

## 2014-01-25 DIAGNOSIS — Z791 Long term (current) use of non-steroidal anti-inflammatories (NSAID): Secondary | ICD-10-CM | POA: Insufficient documentation

## 2014-01-25 DIAGNOSIS — Z79899 Other long term (current) drug therapy: Secondary | ICD-10-CM | POA: Insufficient documentation

## 2014-01-25 DIAGNOSIS — J45909 Unspecified asthma, uncomplicated: Secondary | ICD-10-CM | POA: Insufficient documentation

## 2014-01-25 DIAGNOSIS — G40909 Epilepsy, unspecified, not intractable, without status epilepticus: Secondary | ICD-10-CM | POA: Insufficient documentation

## 2014-01-25 DIAGNOSIS — IMO0002 Reserved for concepts with insufficient information to code with codable children: Secondary | ICD-10-CM | POA: Insufficient documentation

## 2014-01-25 DIAGNOSIS — G8929 Other chronic pain: Secondary | ICD-10-CM | POA: Insufficient documentation

## 2014-01-25 MED ORDER — HYDROCODONE-ACETAMINOPHEN 5-325 MG PO TABS: 1.0000 | ORAL_TABLET | ORAL | Status: DC | PRN

## 2014-01-25 MED ORDER — OXYCODONE-ACETAMINOPHEN 5-325 MG PO TABS
1.0000 | ORAL_TABLET | Freq: Once | ORAL | Status: AC
  Administered 2014-01-25: 1 via ORAL
  Filled 2014-01-25: qty 1

## 2014-01-25 MED ORDER — PREDNISONE (PAK) 10 MG PO TABS
ORAL_TABLET | Freq: Every day | ORAL | Status: DC
Start: 2014-01-25 — End: 2014-02-10
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
## 2014-01-25 NOTE — ED Notes (Signed)
Back pain for 1 week,  With radiation down lt leg.  No known injury

## 2014-01-25 NOTE — ED Notes (Signed)
Pt presents with lower back pain that radiates down left leg and comes around front of leg to knee. Also states her middle back area is aching as well.

## 2014-01-26 NOTE — ED Provider Notes (Signed)
Medical screening examination/treatment/procedure(s) were performed by non-physician practitioner and as supervising physician I was immediately available for consultation/collaboration.   EKG Interpretation None        Aayush Gelpi W Erian Lariviere, MD 01/26/14 1242 

## 2014-02-10 ENCOUNTER — Emergency Department (HOSPITAL_COMMUNITY): Payer: Self-pay

## 2014-02-10 ENCOUNTER — Emergency Department (HOSPITAL_COMMUNITY)
Admission: EM | Admit: 2014-02-10 | Discharge: 2014-02-10 | Disposition: A | Discharge status: Home / Self Care | Payer: Self-pay | Attending: Emergency Medicine | Admitting: Emergency Medicine

## 2014-02-10 ENCOUNTER — Encounter (HOSPITAL_COMMUNITY): Payer: Self-pay | Admitting: Emergency Medicine

## 2014-02-10 DIAGNOSIS — R399 Unspecified symptoms and signs involving the genitourinary system: Secondary | ICD-10-CM

## 2014-02-10 DIAGNOSIS — G8929 Other chronic pain: Secondary | ICD-10-CM | POA: Insufficient documentation

## 2014-02-10 DIAGNOSIS — J45909 Unspecified asthma, uncomplicated: Secondary | ICD-10-CM | POA: Insufficient documentation

## 2014-02-10 DIAGNOSIS — M5126 Other intervertebral disc displacement, lumbar region: Secondary | ICD-10-CM | POA: Insufficient documentation

## 2014-02-10 DIAGNOSIS — Z8719 Personal history of other diseases of the digestive system: Secondary | ICD-10-CM | POA: Insufficient documentation

## 2014-02-10 DIAGNOSIS — Z8659 Personal history of other mental and behavioral disorders: Secondary | ICD-10-CM | POA: Insufficient documentation

## 2014-02-10 DIAGNOSIS — R109 Unspecified abdominal pain: Secondary | ICD-10-CM | POA: Insufficient documentation

## 2014-02-10 DIAGNOSIS — Z8669 Personal history of other diseases of the nervous system and sense organs: Secondary | ICD-10-CM | POA: Insufficient documentation

## 2014-02-10 DIAGNOSIS — F17200 Nicotine dependence, unspecified, uncomplicated: Secondary | ICD-10-CM | POA: Insufficient documentation

## 2014-02-10 DIAGNOSIS — R319 Hematuria, unspecified: Secondary | ICD-10-CM | POA: Insufficient documentation

## 2014-02-10 DIAGNOSIS — M549 Dorsalgia, unspecified: Secondary | ICD-10-CM | POA: Insufficient documentation

## 2014-02-10 DIAGNOSIS — R3915 Urgency of urination: Secondary | ICD-10-CM | POA: Insufficient documentation

## 2014-02-10 DIAGNOSIS — R110 Nausea: Secondary | ICD-10-CM | POA: Insufficient documentation

## 2014-02-10 DIAGNOSIS — Z79899 Other long term (current) drug therapy: Secondary | ICD-10-CM | POA: Insufficient documentation

## 2014-02-10 LAB — URINALYSIS W MICROSCOPIC (NOT AT ARMC)
Bilirubin Urine: NEGATIVE
GLUCOSE, UA: NEGATIVE mg/dL
Ketones, ur: NEGATIVE mg/dL
Leukocytes, UA: NEGATIVE
Nitrite: NEGATIVE
PH: 5.5 (ref 5.0–8.0)
Protein, ur: NEGATIVE mg/dL
SPECIFIC GRAVITY, URINE: 1.01 (ref 1.005–1.030)
Urobilinogen, UA: 0.2 mg/dL (ref 0.0–1.0)

## 2014-02-10 LAB — CBC WITH DIFFERENTIAL/PLATELET
Basophils Absolute: 0 10*3/uL (ref 0.0–0.1)
Basophils Relative: 0 % (ref 0–1)
Eosinophils Absolute: 0.1 10*3/uL (ref 0.0–0.7)
Eosinophils Relative: 1 % (ref 0–5)
HCT: 40.4 % (ref 36.0–46.0)
Hemoglobin: 13.4 g/dL (ref 12.0–15.0)
LYMPHS ABS: 2.2 10*3/uL (ref 0.7–4.0)
LYMPHS PCT: 21 % (ref 12–46)
MCH: 29.5 pg (ref 26.0–34.0)
MCHC: 33.2 g/dL (ref 30.0–36.0)
MCV: 89 fL (ref 78.0–100.0)
Monocytes Absolute: 0.7 10*3/uL (ref 0.1–1.0)
Monocytes Relative: 7 % (ref 3–12)
Neutro Abs: 7.3 10*3/uL (ref 1.7–7.7)
Neutrophils Relative %: 71 % (ref 43–77)
PLATELETS: 189 10*3/uL (ref 150–400)
RBC: 4.54 MIL/uL (ref 3.87–5.11)
RDW: 13.2 % (ref 11.5–15.5)
WBC: 10.3 10*3/uL (ref 4.0–10.5)

## 2014-02-10 LAB — BASIC METABOLIC PANEL
BUN: 9 mg/dL (ref 6–23)
CHLORIDE: 97 meq/L (ref 96–112)
CO2: 26 meq/L (ref 19–32)
Calcium: 9.3 mg/dL (ref 8.4–10.5)
Creatinine, Ser: 0.88 mg/dL (ref 0.50–1.10)
GFR calc Af Amer: >90 mL/min (ref >90)
GFR calc non Af Amer: 87 mL/min — ABNORMAL LOW (ref >90)
GLUCOSE: 105 mg/dL — AB (ref 70–99)
Potassium: 4 mEq/L (ref 3.7–5.3)
SODIUM: 135 meq/L — AB (ref 137–147)

## 2014-02-10 MED ORDER — PROMETHAZINE HCL 25 MG/ML IJ SOLN
12.5000 mg | Freq: Once | INTRAMUSCULAR | Status: AC
  Administered 2014-02-10: 12.5 mg via INTRAVENOUS
  Filled 2014-02-10: qty 1

## 2014-02-10 MED ORDER — FAMOTIDINE IN NACL 20-0.9 MG/50ML-% IV SOLN
20.0000 mg | Freq: Once | INTRAVENOUS | Status: AC
  Administered 2014-02-10: 20 mg via INTRAVENOUS
  Filled 2014-02-10: qty 50

## 2014-02-10 MED ORDER — SODIUM CHLORIDE 0.9 % IV SOLN
INTRAVENOUS | Status: AC
  Administered 2014-02-10: 500 mL/h via INTRAVENOUS

## 2014-02-10 MED ORDER — CEPHALEXIN 500 MG PO CAPS
500.0000 mg | ORAL_CAPSULE | Freq: Three times a day (TID) | ORAL | Status: DC
Start: 2014-02-10 — End: 2014-03-16

## 2014-02-10 MED ORDER — DEXTROSE 5 % IV SOLN
1.0000 g | Freq: Once | INTRAVENOUS | Status: AC
  Administered 2014-02-10: 1 g via INTRAVENOUS
  Filled 2014-02-10: qty 10

## 2014-02-10 MED ORDER — PROMETHAZINE HCL 12.5 MG PO TABS: 12.5000 mg | ORAL_TABLET | Freq: Four times a day (QID) | ORAL | Status: DC | PRN

## 2014-02-10 MED ORDER — HYDROMORPHONE HCL PF 1 MG/ML IJ SOLN
1.0000 mg | Freq: Once | INTRAMUSCULAR | Status: AC
Start: 2014-02-10 — End: 2014-02-10
  Administered 2014-02-10: 1 mg via INTRAVENOUS
  Filled 2014-02-10: qty 1

## 2014-02-10 MED ORDER — HYDROCODONE-ACETAMINOPHEN 5-325 MG PO TABS: 1.0000 | ORAL_TABLET | ORAL | Status: DC | PRN

## 2014-02-10 MED ORDER — KETOROLAC TROMETHAMINE 30 MG/ML IJ SOLN
30.0000 mg | Freq: Once | INTRAMUSCULAR | Status: AC
  Administered 2014-02-10: 30 mg via INTRAVENOUS
  Filled 2014-02-10: qty 1

## 2014-02-10 NOTE — ED Notes (Signed)
Pt c/o r side pain x 2 days with nausea.  Denies vomiting or diarrhea.  Denies history of kidney stones.  C/O pain in r side worsens with urination.
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
## 2014-02-10 NOTE — ED Notes (Signed)
Pt reports bilateral flank pain and RLQ pain. Pt denies any tenderness with palpation

## 2014-02-10 NOTE — ED Notes (Signed)
Patient given discharge instruction, verbalized understand. IV removed, band aid applied. Patient ambulatory out of the department.  

## 2014-02-11 NOTE — ED Provider Notes (Signed)
Medical screening examination/treatment/procedure(s) were performed by non-physician practitioner and as supervising physician I was immediately available for consultation/collaboration.   EKG Interpretation None       Juliet Rude. Rubin Payor, MD 02/11/14 [phone]

## 2014-03-16 ENCOUNTER — Emergency Department (HOSPITAL_COMMUNITY)
Admission: EM | Admit: 2014-03-16 | Discharge: 2014-03-16 | Discharge status: Against Medical Advice | Payer: Self-pay | Attending: Emergency Medicine | Admitting: Emergency Medicine

## 2014-03-16 ENCOUNTER — Encounter (HOSPITAL_COMMUNITY): Payer: Self-pay | Admitting: Emergency Medicine

## 2014-03-16 DIAGNOSIS — S46909A Unspecified injury of unspecified muscle, fascia and tendon at shoulder and upper arm level, unspecified arm, initial encounter: Secondary | ICD-10-CM | POA: Insufficient documentation

## 2014-03-16 DIAGNOSIS — W108XXA Fall (on) (from) other stairs and steps, initial encounter: Secondary | ICD-10-CM | POA: Insufficient documentation

## 2014-03-16 DIAGNOSIS — IMO0002 Reserved for concepts with insufficient information to code with codable children: Secondary | ICD-10-CM | POA: Insufficient documentation

## 2014-03-16 DIAGNOSIS — J45909 Unspecified asthma, uncomplicated: Secondary | ICD-10-CM | POA: Insufficient documentation

## 2014-03-16 DIAGNOSIS — S4980XA Other specified injuries of shoulder and upper arm, unspecified arm, initial encounter: Secondary | ICD-10-CM | POA: Insufficient documentation

## 2014-03-16 DIAGNOSIS — G8929 Other chronic pain: Secondary | ICD-10-CM | POA: Insufficient documentation

## 2014-03-16 DIAGNOSIS — Y929 Unspecified place or not applicable: Secondary | ICD-10-CM | POA: Insufficient documentation

## 2014-03-16 DIAGNOSIS — Y9389 Activity, other specified: Secondary | ICD-10-CM | POA: Insufficient documentation

## 2014-03-16 NOTE — ED Notes (Signed)
Fell on stairs this am, hitting right shoulder back/right upper back area. Denies other S/S.

## 2014-03-16 NOTE — ED Notes (Signed)
Pt left without being seen or treated by PA

## 2014-05-24 ENCOUNTER — Encounter (HOSPITAL_COMMUNITY): Payer: Self-pay | Admitting: Emergency Medicine

## 2014-05-24 ENCOUNTER — Emergency Department (HOSPITAL_COMMUNITY)
Admission: EM | Admit: 2014-05-24 | Discharge: 2014-05-24 | Disposition: A | Discharge status: Home / Self Care | Payer: Self-pay | Attending: Emergency Medicine | Admitting: Emergency Medicine

## 2014-05-24 DIAGNOSIS — Z87828 Personal history of other (healed) physical injury and trauma: Secondary | ICD-10-CM | POA: Insufficient documentation

## 2014-05-24 DIAGNOSIS — G8929 Other chronic pain: Secondary | ICD-10-CM | POA: Insufficient documentation

## 2014-05-24 DIAGNOSIS — Z8659 Personal history of other mental and behavioral disorders: Secondary | ICD-10-CM | POA: Insufficient documentation

## 2014-05-24 DIAGNOSIS — J45909 Unspecified asthma, uncomplicated: Secondary | ICD-10-CM | POA: Insufficient documentation

## 2014-05-24 DIAGNOSIS — F17200 Nicotine dependence, unspecified, uncomplicated: Secondary | ICD-10-CM | POA: Insufficient documentation

## 2014-05-24 DIAGNOSIS — N390 Urinary tract infection, site not specified: Secondary | ICD-10-CM | POA: Insufficient documentation

## 2014-05-24 DIAGNOSIS — M549 Dorsalgia, unspecified: Secondary | ICD-10-CM | POA: Insufficient documentation

## 2014-05-24 DIAGNOSIS — Z8719 Personal history of other diseases of the digestive system: Secondary | ICD-10-CM | POA: Insufficient documentation

## 2014-05-24 LAB — BASIC METABOLIC PANEL
Anion gap: 13 (ref 5–15)
BUN: 14 mg/dL (ref 6–23)
CALCIUM: 9.1 mg/dL (ref 8.4–10.5)
CO2: 23 mEq/L (ref 19–32)
Chloride: 106 mEq/L (ref 96–112)
Creatinine, Ser: 0.91 mg/dL (ref 0.50–1.10)
GFR calc Af Amer: >90 mL/min (ref >90)
GFR calc non Af Amer: 84 mL/min — ABNORMAL LOW (ref >90)
Glucose, Bld: 103 mg/dL — ABNORMAL HIGH (ref 70–99)
POTASSIUM: 3.8 meq/L (ref 3.7–5.3)
Sodium: 142 mEq/L (ref 137–147)

## 2014-05-24 LAB — CBC WITH DIFFERENTIAL/PLATELET
BASOS PCT: 1 % (ref 0–1)
Basophils Absolute: 0 10*3/uL (ref 0.0–0.1)
EOS ABS: 0.1 10*3/uL (ref 0.0–0.7)
Eosinophils Relative: 2 % (ref 0–5)
HCT: 39.7 % (ref 36.0–46.0)
Hemoglobin: 13.6 g/dL (ref 12.0–15.0)
Lymphocytes Relative: 44 % (ref 12–46)
Lymphs Abs: 2.6 10*3/uL (ref 0.7–4.0)
MCH: 30.1 pg (ref 26.0–34.0)
MCHC: 34.3 g/dL (ref 30.0–36.0)
MCV: 87.8 fL (ref 78.0–100.0)
MONO ABS: 0.3 10*3/uL (ref 0.1–1.0)
Monocytes Relative: 4 % (ref 3–12)
NEUTROS ABS: 2.9 10*3/uL (ref 1.7–7.7)
NEUTROS PCT: 49 % (ref 43–77)
Platelets: 207 10*3/uL (ref 150–400)
RBC: 4.52 MIL/uL (ref 3.87–5.11)
RDW: 13.4 % (ref 11.5–15.5)
WBC: 5.9 10*3/uL (ref 4.0–10.5)

## 2014-05-24 LAB — URINALYSIS, ROUTINE W REFLEX MICROSCOPIC
Bilirubin Urine: NEGATIVE
Glucose, UA: NEGATIVE mg/dL
LEUKOCYTES UA: NEGATIVE
NITRITE: POSITIVE — AB
Specific Gravity, Urine: 1.025 (ref 1.005–1.030)
Urobilinogen, UA: 0.2 mg/dL (ref 0.0–1.0)
pH: 5.5 (ref 5.0–8.0)

## 2014-05-24 LAB — URINE MICROSCOPIC-ADD ON

## 2014-05-24 MED ORDER — HYDROCODONE-ACETAMINOPHEN 5-325 MG PO TABS: 1.0000 | ORAL_TABLET | Freq: Four times a day (QID) | ORAL | Status: DC | PRN

## 2014-05-24 MED ORDER — CEPHALEXIN 500 MG PO CAPS: 500.0000 mg | ORAL_CAPSULE | Freq: Four times a day (QID) | ORAL | Status: DC

## 2014-05-24 MED ORDER — HYDROCODONE-ACETAMINOPHEN 5-325 MG PO TABS
1.0000 | ORAL_TABLET | Freq: Once | ORAL | Status: AC
  Administered 2014-05-24: 1 via ORAL
  Filled 2014-05-24: qty 1

## 2014-05-24 MED ORDER — CEPHALEXIN 500 MG PO CAPS
500.0000 mg | ORAL_CAPSULE | Freq: Once | ORAL | Status: AC
  Administered 2014-05-24: 500 mg via ORAL
  Filled 2014-05-24: qty 1

## 2014-05-24 NOTE — Discharge Instructions (Signed)
Follow up with your md after finishing antibiotics

## 2014-05-24 NOTE — ED Notes (Signed)
Pt reports lower back pain radiating to lower abdomen. Pt reports " i think i may have a kidney infection." Pt reports urine is dark. Pt denies n/v/d.

## 2014-05-24 NOTE — ED Notes (Signed)
Bilateral flank pain x3days with urine frequency. Denies dysuria.
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
## 2014-05-27 LAB — URINE CULTURE: Colony Count: >=100000

## 2014-05-28 ENCOUNTER — Telehealth (HOSPITAL_COMMUNITY): Payer: Self-pay

## 2014-05-28 NOTE — ED Notes (Signed)
Post ED Visit - Positive Culture Follow-up  Culture report reviewed by antimicrobial stewardship pharmacist:  Wes Dulaney, Pharm.D., BCPS  Celedonio Miyamoto, Pharm.D., BCPS  Georgina Pillion, Pharm.D., BCPS  [place], [address].D., BCPS, AAHIVP  Estella Husk, Pharm.D., BCPS, AAHIVP  Carly Sabat, Pharm.D.  Enzo Bi, [address].D.  Positive urineculture Treated with cephalexin, organism sensitive to the same and no further patient follow-up is required at this time.  Ashley Jacobs 05/28/2014, 12:42 PM

## 2014-05-29 ENCOUNTER — Telehealth (HOSPITAL_COMMUNITY): Payer: Self-pay | Admitting: *Deleted

## 2014-05-29 NOTE — ED Notes (Signed)
(+)  urine culture, treated with Cephalexin, OK per Peter Minium

## 2014-06-27 ENCOUNTER — Emergency Department (HOSPITAL_COMMUNITY): Payer: Self-pay

## 2014-06-27 ENCOUNTER — Emergency Department (HOSPITAL_COMMUNITY)
Admission: EM | Admit: 2014-06-27 | Discharge: 2014-06-27 | Disposition: A | Discharge status: Home / Self Care | Payer: Self-pay | Attending: Emergency Medicine | Admitting: Emergency Medicine

## 2014-06-27 ENCOUNTER — Encounter (HOSPITAL_COMMUNITY): Payer: Self-pay | Admitting: Emergency Medicine

## 2014-06-27 DIAGNOSIS — Z720 Tobacco use: Secondary | ICD-10-CM | POA: Insufficient documentation

## 2014-06-27 DIAGNOSIS — J45909 Unspecified asthma, uncomplicated: Secondary | ICD-10-CM | POA: Insufficient documentation

## 2014-06-27 DIAGNOSIS — M79671 Pain in right foot: Secondary | ICD-10-CM | POA: Insufficient documentation

## 2014-06-27 DIAGNOSIS — Z791 Long term (current) use of non-steroidal anti-inflammatories (NSAID): Secondary | ICD-10-CM | POA: Insufficient documentation

## 2014-06-27 DIAGNOSIS — Z8659 Personal history of other mental and behavioral disorders: Secondary | ICD-10-CM | POA: Insufficient documentation

## 2014-06-27 DIAGNOSIS — Z8719 Personal history of other diseases of the digestive system: Secondary | ICD-10-CM | POA: Insufficient documentation

## 2014-06-27 DIAGNOSIS — G8929 Other chronic pain: Secondary | ICD-10-CM | POA: Insufficient documentation

## 2014-06-27 MED ORDER — HYDROCODONE-ACETAMINOPHEN 5-325 MG PO TABS
1.0000 | ORAL_TABLET | Freq: Once | ORAL | Status: AC
  Administered 2014-06-27: 1 via ORAL
  Filled 2014-06-27: qty 1

## 2014-06-27 MED ORDER — DEXAMETHASONE 4 MG PO TABS: ORAL_TABLET | ORAL | Status: DC

## 2014-06-27 MED ORDER — PREDNISONE 10 MG PO TABS
60.0000 mg | ORAL_TABLET | Freq: Once | ORAL | Status: AC
  Administered 2014-06-27: 60 mg via ORAL
  Filled 2014-06-27 (×2): qty 1

## 2014-06-27 MED ORDER — HYDROCODONE-ACETAMINOPHEN 5-325 MG PO TABS
1.0000 | ORAL_TABLET | ORAL | Status: DC | PRN
Start: 2014-06-27 — End: 2014-07-28

## 2014-06-27 NOTE — Discharge Instructions (Signed)
Your x-ray is negative for any fracture, or foreign body in the foot. Please use warm Epsom salt soaks daily until the uncomfortable area has resolved. Please use Decadron 2 times daily with food. Please use Norco for pain if needed. Please see the podiatry specialist listed above, or the podiatry specialist of your choice for additional evaluation of the painful area of your foot if not improving.

## 2014-06-27 NOTE — ED Notes (Signed)
Discharge instructions given, pt demonstrated teach back and verbal understanding. No concerns voiced.  

## 2014-06-27 NOTE — ED Notes (Signed)
Pt reports left foot pain with ambulation. Pt reports "hard knot" to bottom of left x1 week. Denies any known injury.
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
## 2014-06-29 NOTE — ED Provider Notes (Signed)
Medical screening examination/treatment/procedure(s) were performed by non-physician practitioner and as supervising physician I was immediately available for consultation/collaboration.     Shaquinta Peruski, MD 06/29/14 0731 

## 2014-07-19 ENCOUNTER — Encounter (HOSPITAL_COMMUNITY): Payer: Self-pay | Admitting: Emergency Medicine

## 2014-07-28 ENCOUNTER — Emergency Department (HOSPITAL_COMMUNITY)
Admission: EM | Admit: 2014-07-28 | Discharge: 2014-07-28 | Disposition: A | Discharge status: Home / Self Care | Payer: Self-pay | Attending: Emergency Medicine | Admitting: Emergency Medicine

## 2014-07-28 ENCOUNTER — Encounter (HOSPITAL_COMMUNITY): Payer: Self-pay | Admitting: Emergency Medicine

## 2014-07-28 DIAGNOSIS — R569 Unspecified convulsions: Secondary | ICD-10-CM | POA: Insufficient documentation

## 2014-07-28 DIAGNOSIS — Z791 Long term (current) use of non-steroidal anti-inflammatories (NSAID): Secondary | ICD-10-CM | POA: Insufficient documentation

## 2014-07-28 DIAGNOSIS — J45909 Unspecified asthma, uncomplicated: Secondary | ICD-10-CM | POA: Insufficient documentation

## 2014-07-28 DIAGNOSIS — S39012A Strain of muscle, fascia and tendon of lower back, initial encounter: Secondary | ICD-10-CM | POA: Insufficient documentation

## 2014-07-28 DIAGNOSIS — Y998 Other external cause status: Secondary | ICD-10-CM | POA: Insufficient documentation

## 2014-07-28 DIAGNOSIS — Z8739 Personal history of other diseases of the musculoskeletal system and connective tissue: Secondary | ICD-10-CM | POA: Insufficient documentation

## 2014-07-28 DIAGNOSIS — F419 Anxiety disorder, unspecified: Secondary | ICD-10-CM | POA: Insufficient documentation

## 2014-07-28 DIAGNOSIS — Z720 Tobacco use: Secondary | ICD-10-CM | POA: Insufficient documentation

## 2014-07-28 DIAGNOSIS — G8929 Other chronic pain: Secondary | ICD-10-CM | POA: Insufficient documentation

## 2014-07-28 DIAGNOSIS — X58XXXA Exposure to other specified factors, initial encounter: Secondary | ICD-10-CM | POA: Insufficient documentation

## 2014-07-28 DIAGNOSIS — Z8719 Personal history of other diseases of the digestive system: Secondary | ICD-10-CM | POA: Insufficient documentation

## 2014-07-28 DIAGNOSIS — Y9289 Other specified places as the place of occurrence of the external cause: Secondary | ICD-10-CM | POA: Insufficient documentation

## 2014-07-28 DIAGNOSIS — Y9389 Activity, other specified: Secondary | ICD-10-CM | POA: Insufficient documentation

## 2014-07-28 MED ORDER — DIAZEPAM 5 MG PO TABS
10.0000 mg | ORAL_TABLET | Freq: Once | ORAL | Status: AC
  Administered 2014-07-28: 10 mg via ORAL
  Filled 2014-07-28: qty 2

## 2014-07-28 MED ORDER — PREDNISONE 10 MG PO TABS
60.0000 mg | ORAL_TABLET | Freq: Once | ORAL | Status: AC
  Administered 2014-07-28: 60 mg via ORAL
  Filled 2014-07-28 (×2): qty 1

## 2014-07-28 MED ORDER — DIAZEPAM 5 MG PO TABS: ORAL_TABLET | ORAL | Status: DC

## 2014-07-28 MED ORDER — DIAZEPAM 5 MG PO TABS: 5.0000 mg | ORAL_TABLET | Freq: Four times a day (QID) | ORAL | Status: DC | PRN

## 2014-07-28 MED ORDER — HYDROCODONE-ACETAMINOPHEN 5-325 MG PO TABS
2.0000 | ORAL_TABLET | Freq: Once | ORAL | Status: AC
  Administered 2014-07-28: 2 via ORAL
  Filled 2014-07-28: qty 2

## 2014-07-28 MED ORDER — ONDANSETRON HCL 4 MG PO TABS
4.0000 mg | ORAL_TABLET | Freq: Once | ORAL | Status: AC
  Administered 2014-07-28: 4 mg via ORAL
  Filled 2014-07-28: qty 1

## 2014-07-28 MED ORDER — HYDROCODONE-ACETAMINOPHEN 5-325 MG PO TABS: 1.0000 | ORAL_TABLET | ORAL | Status: DC | PRN

## 2014-07-28 MED ORDER — HYDROCODONE-ACETAMINOPHEN 5-325 MG PO TABS
1.0000 | ORAL_TABLET | ORAL | Status: DC | PRN
Start: 2014-07-28 — End: 2015-05-21

## 2014-07-28 MED ORDER — DEXAMETHASONE 4 MG PO TABS: 4.0000 mg | ORAL_TABLET | Freq: Two times a day (BID) | ORAL | Status: DC
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
## 2014-07-28 NOTE — ED Notes (Signed)
Pt reports she was moving furniture yesterday, woke up this am with lower back pain and edema.

## 2014-07-28 NOTE — Discharge Instructions (Signed)
Heat to your lower back maybe helpful. Please rest your back is much as possible. Please call the [facility], or the adult medicine clinic here in [place] to establish a primary care physician to assist you with your back pain. Please use diazepam and Decadron daily. May use Norco for pain if needed. Diazepam and Norco may cause drowsiness, please use these medicines with caution. Lumbosacral Strain Lumbosacral strain is a strain of any of the parts that make up your lumbosacral vertebrae. Your lumbosacral vertebrae are the bones that make up the lower third of your backbone. Your lumbosacral vertebrae are held together by muscles and tough, fibrous tissue (ligaments).  CAUSES  A sudden blow to your back can cause lumbosacral strain. Also, anything that causes an excessive stretch of the muscles in the low back can cause this strain. This is typically seen when people exert themselves strenuously, fall, lift heavy objects, bend, or crouch repeatedly. RISK FACTORS  Physically demanding work.  Participation in pushing or pulling sports or sports that require a sudden twist of the back (tennis, golf, baseball).  Weight lifting.  Excessive lower back curvature.  Forward-tilted pelvis.  Weak back or abdominal muscles or both.  Tight hamstrings. SIGNS AND SYMPTOMS  Lumbosacral strain may cause pain in the area of your injury or pain that moves (radiates) down your leg.  DIAGNOSIS Your health care provider can often diagnose lumbosacral strain through a physical exam. In some cases, you may need tests such as X-ray exams.  TREATMENT  Treatment for your lower back injury depends on many factors that your clinician will have to evaluate. However, most treatment will include the use of anti-inflammatory medicines. HOME CARE INSTRUCTIONS   Avoid hard physical activities (tennis, racquetball, waterskiing) if you are not in proper physical condition for it. This may aggravate or  create problems.  If you have a back problem, avoid sports requiring sudden body movements. Swimming and walking are generally safer activities.  Maintain good posture.  Maintain a healthy weight.  For acute conditions, you may put ice on the injured area.  Put ice in a plastic bag.  Place a towel between your skin and the bag.  Leave the ice on for 20 minutes, 2-3 times a day.  When the low back starts healing, stretching and strengthening exercises may be recommended. SEEK MEDICAL CARE IF:  Your back pain is getting worse.  You experience severe back pain not relieved with medicines. SEEK IMMEDIATE MEDICAL CARE IF:   You have numbness, tingling, weakness, or problems with the use of your arms or legs.  There is a change in bowel or bladder control.  You have increasing pain in any area of the body, including your belly (abdomen).  You notice shortness of breath, dizziness, or feel faint.  You feel sick to your stomach (nauseous), are throwing up (vomiting), or become sweaty.  You notice discoloration of your toes or legs, or your feet get very cold. MAKE SURE YOU:   Understand these instructions.  Will watch your condition.  Will get help right away if you are not doing well or get worse. Document Released: 06/13/2005 Document Revised: 09/08/2013 Document Reviewed: 04/22/2013 [facility] Patient Information 2015 [place], [place]. This information is not intended to replace advice given to you by your health care provider. Make sure you discuss any questions you have with your health care provider.

## 2015-05-21 ENCOUNTER — Emergency Department (HOSPITAL_COMMUNITY)
Admission: EM | Admit: 2015-05-21 | Discharge: 2015-05-21 | Disposition: A | Discharge status: Home / Self Care | Payer: Self-pay | Attending: Emergency Medicine | Admitting: Emergency Medicine

## 2015-05-21 ENCOUNTER — Encounter (HOSPITAL_COMMUNITY): Payer: Self-pay

## 2015-05-21 DIAGNOSIS — Z8739 Personal history of other diseases of the musculoskeletal system and connective tissue: Secondary | ICD-10-CM | POA: Insufficient documentation

## 2015-05-21 DIAGNOSIS — F419 Anxiety disorder, unspecified: Secondary | ICD-10-CM | POA: Insufficient documentation

## 2015-05-21 DIAGNOSIS — K047 Periapical abscess without sinus: Secondary | ICD-10-CM | POA: Insufficient documentation

## 2015-05-21 DIAGNOSIS — K029 Dental caries, unspecified: Secondary | ICD-10-CM | POA: Insufficient documentation

## 2015-05-21 DIAGNOSIS — R590 Localized enlarged lymph nodes: Secondary | ICD-10-CM | POA: Insufficient documentation

## 2015-05-21 DIAGNOSIS — Z7952 Long term (current) use of systemic steroids: Secondary | ICD-10-CM | POA: Insufficient documentation

## 2015-05-21 DIAGNOSIS — Z720 Tobacco use: Secondary | ICD-10-CM | POA: Insufficient documentation

## 2015-05-21 DIAGNOSIS — Z8659 Personal history of other mental and behavioral disorders: Secondary | ICD-10-CM | POA: Insufficient documentation

## 2015-05-21 DIAGNOSIS — J45909 Unspecified asthma, uncomplicated: Secondary | ICD-10-CM | POA: Insufficient documentation

## 2015-05-21 DIAGNOSIS — G8929 Other chronic pain: Secondary | ICD-10-CM | POA: Insufficient documentation

## 2015-05-21 MED ORDER — AMOXICILLIN 500 MG PO CAPS: 500.0000 mg | ORAL_CAPSULE | Freq: Three times a day (TID) | ORAL | Status: DC

## 2015-05-21 MED ORDER — OXYCODONE-ACETAMINOPHEN 5-325 MG PO TABS
1.0000 | ORAL_TABLET | Freq: Once | ORAL | Status: AC
  Administered 2015-05-21: 1 via ORAL
  Filled 2015-05-21: qty 1

## 2015-05-21 MED ORDER — AMOXICILLIN 250 MG PO CAPS
500.0000 mg | ORAL_CAPSULE | Freq: Once | ORAL | Status: AC
  Administered 2015-05-21: 500 mg via ORAL
  Filled 2015-05-21: qty 2

## 2015-05-21 MED ORDER — HYDROCODONE-ACETAMINOPHEN 5-325 MG PO TABS: 1.0000 | ORAL_TABLET | ORAL | Status: DC | PRN
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
## 2015-05-21 NOTE — ED Notes (Signed)
Left lower dental pain with swelling. Patient states she has had parts of her tooth breaking off, and yesterday a piece broke off and now has pain and swelling.

## 2015-05-21 NOTE — Discharge Instructions (Signed)
You will need to follow up with a dentist as soon as possible.

## 2016-01-22 ENCOUNTER — Emergency Department (HOSPITAL_COMMUNITY)
Admission: EM | Admit: 2016-01-22 | Discharge: 2016-01-23 | Disposition: A | Discharge status: Home / Self Care | Payer: Self-pay | Attending: Emergency Medicine | Admitting: Emergency Medicine

## 2016-01-22 ENCOUNTER — Emergency Department (HOSPITAL_COMMUNITY): Payer: Self-pay

## 2016-01-22 ENCOUNTER — Encounter (HOSPITAL_COMMUNITY): Payer: Self-pay | Admitting: *Deleted

## 2016-01-22 DIAGNOSIS — J45909 Unspecified asthma, uncomplicated: Secondary | ICD-10-CM | POA: Insufficient documentation

## 2016-01-22 DIAGNOSIS — R102 Pelvic and perineal pain: Secondary | ICD-10-CM

## 2016-01-22 DIAGNOSIS — F17210 Nicotine dependence, cigarettes, uncomplicated: Secondary | ICD-10-CM | POA: Insufficient documentation

## 2016-01-22 DIAGNOSIS — M549 Dorsalgia, unspecified: Secondary | ICD-10-CM | POA: Insufficient documentation

## 2016-01-22 DIAGNOSIS — F329 Major depressive disorder, single episode, unspecified: Secondary | ICD-10-CM | POA: Insufficient documentation

## 2016-01-22 DIAGNOSIS — N390 Urinary tract infection, site not specified: Secondary | ICD-10-CM | POA: Insufficient documentation

## 2016-01-22 DIAGNOSIS — Z79899 Other long term (current) drug therapy: Secondary | ICD-10-CM | POA: Insufficient documentation

## 2016-01-22 DIAGNOSIS — R319 Hematuria, unspecified: Secondary | ICD-10-CM

## 2016-01-22 LAB — URINE MICROSCOPIC-ADD ON

## 2016-01-22 LAB — CBC WITH DIFFERENTIAL/PLATELET
BASOS ABS: 0 10*3/uL (ref 0.0–0.1)
BASOS PCT: 1 %
Eosinophils Absolute: 0.1 10*3/uL (ref 0.0–0.7)
Eosinophils Relative: 2 %
HCT: 37.7 % (ref 36.0–46.0)
HEMOGLOBIN: 12.7 g/dL (ref 12.0–15.0)
Lymphocytes Relative: 45 %
Lymphs Abs: 2.2 10*3/uL (ref 0.7–4.0)
MCH: 31.1 pg (ref 26.0–34.0)
MCHC: 33.7 g/dL (ref 30.0–36.0)
MCV: 92.2 fL (ref 78.0–100.0)
Monocytes Absolute: 0.1 10*3/uL (ref 0.1–1.0)
Monocytes Relative: 2 %
NEUTROS PCT: 50 %
Neutro Abs: 2.4 10*3/uL (ref 1.7–7.7)
Platelets: 179 10*3/uL (ref 150–400)
RBC: 4.09 MIL/uL (ref 3.87–5.11)
RDW: 12.5 % (ref 11.5–15.5)
WBC: 4.8 10*3/uL (ref 4.0–10.5)

## 2016-01-22 LAB — URINALYSIS, ROUTINE W REFLEX MICROSCOPIC
BILIRUBIN URINE: NEGATIVE
Glucose, UA: NEGATIVE mg/dL
KETONES UR: NEGATIVE mg/dL
LEUKOCYTES UA: NEGATIVE
NITRITE: NEGATIVE
PH: 5.5 (ref 5.0–8.0)
Specific Gravity, Urine: 1.025 (ref 1.005–1.030)

## 2016-01-22 LAB — BASIC METABOLIC PANEL
ANION GAP: 7 (ref 5–15)
BUN: 14 mg/dL (ref 6–20)
CHLORIDE: 106 mmol/L (ref 101–111)
CO2: 28 mmol/L (ref 22–32)
CREATININE: 0.86 mg/dL (ref 0.44–1.00)
Calcium: 9 mg/dL (ref 8.9–10.3)
GFR calc non Af Amer: >60 mL/min (ref >60)
Glucose, Bld: 111 mg/dL — ABNORMAL HIGH (ref 65–99)
Potassium: 5.6 mmol/L — ABNORMAL HIGH (ref 3.5–5.1)
SODIUM: 141 mmol/L (ref 135–145)

## 2016-01-22 MED ORDER — HYDROCODONE-ACETAMINOPHEN 5-325 MG PO TABS: 1.0000 | ORAL_TABLET | ORAL | Status: DC | PRN

## 2016-01-22 MED ORDER — CIPROFLOXACIN HCL 500 MG PO TABS: 500.0000 mg | ORAL_TABLET | Freq: Two times a day (BID) | ORAL | Status: DC

## 2016-01-22 MED ORDER — DEXTROSE 5 % IV SOLN
1.0000 g | Freq: Once | INTRAVENOUS | Status: AC
  Administered 2016-01-23: 1 g via INTRAVENOUS
  Filled 2016-01-22: qty 10

## 2016-01-22 MED ORDER — ONDANSETRON HCL 4 MG/2ML IJ SOLN
4.0000 mg | Freq: Once | INTRAMUSCULAR | Status: AC
  Administered 2016-01-22: 4 mg via INTRAVENOUS
  Filled 2016-01-22: qty 2

## 2016-01-22 MED ORDER — MORPHINE SULFATE (PF) 4 MG/ML IV SOLN
4.0000 mg | INTRAVENOUS | Status: DC | PRN
  Administered 2016-01-22: 4 mg via INTRAVENOUS
  Filled 2016-01-22: qty 1

## 2016-01-22 MED ORDER — HYDROMORPHONE HCL 1 MG/ML IJ SOLN
1.0000 mg | Freq: Once | INTRAMUSCULAR | Status: AC
  Administered 2016-01-22: 1 mg via INTRAVENOUS
  Filled 2016-01-22: qty 1
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
## 2016-01-22 NOTE — Discharge Instructions (Signed)

## 2016-01-22 NOTE — ED Notes (Signed)
Pt c/o bilateral flank and abdominal pain that started yesterday; pt states she has vomited x 1 and is unable to urinate as much as she usually does

## 2016-05-28 ENCOUNTER — Emergency Department (HOSPITAL_COMMUNITY)
Admission: EM | Admit: 2016-05-28 | Discharge: 2016-05-28 | Disposition: A | Discharge status: Home / Self Care | Payer: Self-pay | Attending: Emergency Medicine | Admitting: Emergency Medicine

## 2016-05-28 ENCOUNTER — Encounter (HOSPITAL_COMMUNITY): Payer: Self-pay | Admitting: Emergency Medicine

## 2016-05-28 DIAGNOSIS — R109 Unspecified abdominal pain: Secondary | ICD-10-CM | POA: Insufficient documentation

## 2016-05-28 DIAGNOSIS — Z79899 Other long term (current) drug therapy: Secondary | ICD-10-CM | POA: Insufficient documentation

## 2016-05-28 DIAGNOSIS — F17210 Nicotine dependence, cigarettes, uncomplicated: Secondary | ICD-10-CM | POA: Insufficient documentation

## 2016-05-28 DIAGNOSIS — J45909 Unspecified asthma, uncomplicated: Secondary | ICD-10-CM | POA: Insufficient documentation

## 2016-05-28 LAB — URINALYSIS, ROUTINE W REFLEX MICROSCOPIC
Glucose, UA: NEGATIVE mg/dL
Hgb urine dipstick: NEGATIVE
Ketones, ur: >80 mg/dL — AB
Leukocytes, UA: NEGATIVE
Nitrite: NEGATIVE
Protein, ur: 100 mg/dL — AB
Specific Gravity, Urine: 1.025 (ref 1.005–1.030)
pH: 6 (ref 5.0–8.0)

## 2016-05-28 LAB — CBC WITH DIFFERENTIAL/PLATELET
Basophils Absolute: 0 10*3/uL (ref 0.0–0.1)
Basophils Relative: 0 %
Eosinophils Absolute: 0 10*3/uL (ref 0.0–0.7)
Eosinophils Relative: 1 %
HCT: 47.4 % — ABNORMAL HIGH (ref 36.0–46.0)
Hemoglobin: 16.3 g/dL — ABNORMAL HIGH (ref 12.0–15.0)
Lymphocytes Relative: 18 %
Lymphs Abs: 1.6 10*3/uL (ref 0.7–4.0)
MCH: 29.7 pg (ref 26.0–34.0)
MCHC: 34.4 g/dL (ref 30.0–36.0)
MCV: 86.5 fL (ref 78.0–100.0)
Monocytes Absolute: 0.5 10*3/uL (ref 0.1–1.0)
Monocytes Relative: 6 %
Neutro Abs: 6.6 10*3/uL (ref 1.7–7.7)
Neutrophils Relative %: 75 %
Platelets: 194 10*3/uL (ref 150–400)
RBC: 5.48 MIL/uL — ABNORMAL HIGH (ref 3.87–5.11)
RDW: 13 % (ref 11.5–15.5)
WBC: 8.7 10*3/uL (ref 4.0–10.5)

## 2016-05-28 LAB — BASIC METABOLIC PANEL
Anion gap: 15 (ref 5–15)
BUN: 10 mg/dL (ref 6–20)
CO2: 23 mmol/L (ref 22–32)
Calcium: 9.8 mg/dL (ref 8.9–10.3)
Chloride: 95 mmol/L — ABNORMAL LOW (ref 101–111)
Creatinine, Ser: 0.77 mg/dL (ref 0.44–1.00)
GFR calc Af Amer: >60 mL/min (ref >60)
GFR calc non Af Amer: >60 mL/min (ref >60)
Glucose, Bld: 119 mg/dL — ABNORMAL HIGH (ref 65–99)
Potassium: 3.5 mmol/L (ref 3.5–5.1)
Sodium: 133 mmol/L — ABNORMAL LOW (ref 135–145)

## 2016-05-28 LAB — URINE MICROSCOPIC-ADD ON

## 2016-05-28 LAB — I-STAT CG4 LACTIC ACID, ED
Lactic Acid, Venous: 2.06 mmol/L (ref 0.5–1.9)
Lactic Acid, Venous: 0.75 mmol/L (ref 0.5–1.9)

## 2016-05-28 MED ORDER — HYDROMORPHONE HCL 1 MG/ML IJ SOLN
1.0000 mg | Freq: Once | INTRAMUSCULAR | Status: AC
  Administered 2016-05-28: 1 mg via INTRAVENOUS
  Filled 2016-05-28: qty 1

## 2016-05-28 MED ORDER — DEXTROSE 5 % IV SOLN
1.0000 g | Freq: Once | INTRAVENOUS | Status: AC
  Administered 2016-05-28: 1 g via INTRAVENOUS
  Filled 2016-05-28: qty 10

## 2016-05-28 MED ORDER — CEPHALEXIN 500 MG PO CAPS: 500.0000 mg | ORAL_CAPSULE | Freq: Four times a day (QID) | ORAL | 0 refills | Status: DC

## 2016-05-28 MED ORDER — SODIUM CHLORIDE 0.9 % IV BOLUS (SEPSIS)
2000.0000 mL | Freq: Once | INTRAVENOUS | Status: AC
  Administered 2016-05-28: 2000 mL via INTRAVENOUS

## 2016-05-28 MED ORDER — KETOROLAC TROMETHAMINE 30 MG/ML IJ SOLN
15.0000 mg | Freq: Once | INTRAMUSCULAR | Status: AC
  Administered 2016-05-28: 15 mg via INTRAVENOUS
  Filled 2016-05-28: qty 1

## 2016-05-28 MED ORDER — ONDANSETRON HCL 4 MG/2ML IJ SOLN
4.0000 mg | Freq: Once | INTRAMUSCULAR | Status: AC
  Administered 2016-05-28: 4 mg via INTRAVENOUS
  Filled 2016-05-28: qty 2

## 2016-05-28 NOTE — ED Triage Notes (Signed)
Pt complains of back pain x3 days with leive by lying on side with pillow between legs. Pt is experiencing frequent urination with little urination at a time. Pt states that pain is on left side and radiates to left side. Pt states that pain has been worsen since Friday.

## 2016-05-28 NOTE — ED Notes (Addendum)
Patient mad about not receiving pain medications to go home with stating "a stupid antibiotic aint gonna help my pain". I explained to the patient that her pain was coming from her infection and the antibiotic would help the infection, thus helping the pain. Patient still mad about not getting more pain medication. Kohut informed

## 2016-05-28 NOTE — ED Notes (Signed)
MD at bedside. 
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
## 2016-05-29 ENCOUNTER — Telehealth (HOSPITAL_BASED_OUTPATIENT_CLINIC_OR_DEPARTMENT_OTHER): Payer: Self-pay | Admitting: *Deleted

## 2016-05-29 ENCOUNTER — Emergency Department (HOSPITAL_COMMUNITY): Payer: Self-pay

## 2016-05-29 ENCOUNTER — Observation Stay (HOSPITAL_COMMUNITY)
Admission: EM | Admit: 2016-05-29 | Discharge: 2016-05-30 | Disposition: A | Discharge status: Home / Self Care | Payer: Self-pay | Attending: Internal Medicine | Admitting: Internal Medicine

## 2016-05-29 ENCOUNTER — Encounter (HOSPITAL_COMMUNITY): Payer: Self-pay | Admitting: Emergency Medicine

## 2016-05-29 DIAGNOSIS — F1921 Other psychoactive substance dependence, in remission: Secondary | ICD-10-CM | POA: Diagnosis present

## 2016-05-29 DIAGNOSIS — Z792 Long term (current) use of antibiotics: Secondary | ICD-10-CM | POA: Insufficient documentation

## 2016-05-29 DIAGNOSIS — F329 Major depressive disorder, single episode, unspecified: Secondary | ICD-10-CM

## 2016-05-29 DIAGNOSIS — G8929 Other chronic pain: Secondary | ICD-10-CM | POA: Diagnosis present

## 2016-05-29 DIAGNOSIS — R569 Unspecified convulsions: Secondary | ICD-10-CM

## 2016-05-29 DIAGNOSIS — Z7951 Long term (current) use of inhaled steroids: Secondary | ICD-10-CM | POA: Insufficient documentation

## 2016-05-29 DIAGNOSIS — N390 Urinary tract infection, site not specified: Principal | ICD-10-CM | POA: Insufficient documentation

## 2016-05-29 DIAGNOSIS — J45909 Unspecified asthma, uncomplicated: Secondary | ICD-10-CM | POA: Insufficient documentation

## 2016-05-29 DIAGNOSIS — R109 Unspecified abdominal pain: Secondary | ICD-10-CM | POA: Insufficient documentation

## 2016-05-29 DIAGNOSIS — Z79899 Other long term (current) drug therapy: Secondary | ICD-10-CM | POA: Insufficient documentation

## 2016-05-29 DIAGNOSIS — R7881 Bacteremia: Secondary | ICD-10-CM | POA: Insufficient documentation

## 2016-05-29 DIAGNOSIS — N10 Acute tubulo-interstitial nephritis: Secondary | ICD-10-CM | POA: Diagnosis present

## 2016-05-29 DIAGNOSIS — M549 Dorsalgia, unspecified: Secondary | ICD-10-CM

## 2016-05-29 DIAGNOSIS — F32A Depression, unspecified: Secondary | ICD-10-CM | POA: Diagnosis present

## 2016-05-29 LAB — URINALYSIS, ROUTINE W REFLEX MICROSCOPIC
BILIRUBIN URINE: NEGATIVE
Glucose, UA: NEGATIVE mg/dL
HGB URINE DIPSTICK: NEGATIVE
Leukocytes, UA: NEGATIVE
Nitrite: NEGATIVE
Protein, ur: NEGATIVE mg/dL
SPECIFIC GRAVITY, URINE: 1.01 (ref 1.005–1.030)
pH: 6 (ref 5.0–8.0)

## 2016-05-29 LAB — CBC WITH DIFFERENTIAL/PLATELET
Basophils Absolute: 0 10*3/uL (ref 0.0–0.1)
Basophils Relative: 0 %
Eosinophils Absolute: 0.1 10*3/uL (ref 0.0–0.7)
Eosinophils Relative: 1 %
HEMATOCRIT: 37.8 % (ref 36.0–46.0)
HEMOGLOBIN: 12.9 g/dL (ref 12.0–15.0)
LYMPHS ABS: 2.3 10*3/uL (ref 0.7–4.0)
LYMPHS PCT: 33 %
MCH: 29.5 pg (ref 26.0–34.0)
MCHC: 34.1 g/dL (ref 30.0–36.0)
MCV: 86.3 fL (ref 78.0–100.0)
MONOS PCT: 6 %
Monocytes Absolute: 0.4 10*3/uL (ref 0.1–1.0)
NEUTROS ABS: 4 10*3/uL (ref 1.7–7.7)
NEUTROS PCT: 60 %
Platelets: 199 10*3/uL (ref 150–400)
RBC: 4.38 MIL/uL (ref 3.87–5.11)
RDW: 13 % (ref 11.5–15.5)
WBC: 6.9 10*3/uL (ref 4.0–10.5)

## 2016-05-29 LAB — BLOOD CULTURE ID PANEL (REFLEXED)

## 2016-05-29 LAB — COMPREHENSIVE METABOLIC PANEL
ALBUMIN: 3.5 g/dL (ref 3.5–5.0)
ALT: 33 U/L (ref 14–54)
ANION GAP: 13 (ref 5–15)
AST: 30 U/L (ref 15–41)
Alkaline Phosphatase: 77 U/L (ref 38–126)
BUN: 11 mg/dL (ref 6–20)
CHLORIDE: 101 mmol/L (ref 101–111)
CO2: 25 mmol/L (ref 22–32)
Calcium: 9.1 mg/dL (ref 8.9–10.3)
Creatinine, Ser: 0.59 mg/dL (ref 0.44–1.00)
GFR calc non Af Amer: >60 mL/min (ref >60)
Glucose, Bld: 103 mg/dL — ABNORMAL HIGH (ref 65–99)
Potassium: 3.3 mmol/L — ABNORMAL LOW (ref 3.5–5.1)
SODIUM: 139 mmol/L (ref 135–145)
Total Bilirubin: 0.6 mg/dL (ref 0.3–1.2)
Total Protein: 7.2 g/dL (ref 6.5–8.1)

## 2016-05-29 LAB — LACTIC ACID, PLASMA: LACTIC ACID, VENOUS: 0.8 mmol/L (ref 0.5–1.9)

## 2016-05-29 MED ORDER — IOPAMIDOL (ISOVUE-300) INJECTION 61%
INTRAVENOUS | Status: AC
  Filled 2016-05-29: qty 30

## 2016-05-29 MED ORDER — SODIUM CHLORIDE 0.9 % IV BOLUS (SEPSIS)
1000.0000 mL | Freq: Once | INTRAVENOUS | Status: AC
  Administered 2016-05-30: 1000 mL via INTRAVENOUS

## 2016-05-29 MED ORDER — FENTANYL CITRATE (PF) 100 MCG/2ML IJ SOLN
50.0000 ug | Freq: Once | INTRAMUSCULAR | Status: AC
  Administered 2016-05-29: 50 ug via INTRAVENOUS
  Filled 2016-05-29: qty 2

## 2016-05-29 MED ORDER — HYDROMORPHONE HCL 1 MG/ML IJ SOLN
1.0000 mg | Freq: Once | INTRAMUSCULAR | Status: AC
  Administered 2016-05-29: 1 mg via INTRAVENOUS
  Filled 2016-05-29: qty 1

## 2016-05-29 MED ORDER — DEXTROSE 5 % IV SOLN
1.0000 g | Freq: Once | INTRAVENOUS | Status: AC
  Administered 2016-05-29: 1 g via INTRAVENOUS
  Filled 2016-05-29: qty 10

## 2016-05-29 NOTE — ED Triage Notes (Signed)
Pt c/o lower back pain and states she was called to come back due to positive blood culture.

## 2016-05-29 NOTE — ED Notes (Signed)
Called pt not in waiting room

## 2016-05-29 NOTE — Telephone Encounter (Signed)
Post ED Visit - Positive Culture Follow-up: Successful Patient Follow-Up  Culture assessed and recommendations reviewed by: []  Enzo BiNathan Batchelder, Pharm.D. []  Celedonio MiyamotoJeremy Frens, Pharm.D., BCPS []  Garvin FilaMike Maccia, Pharm.D. []  Georgina PillionElizabeth Martin, Pharm.D., BCPS []  BellevueMinh Pham, [address] BoulevardPharm.D., BCPS, AAHIVP []  Estella HuskMichelle Turner, Pharm.D., BCPS, AAHIVP []  Tennis Mustassie Stewart, Pharm.D. []  Sherle Poeob Vincent, [address]Pharm[address]D.  Positive blood culture   []  Patient discharged without antimicrobial prescription and treatment is now indicated []  Organism is resistant to prescribed ED discharge antimicrobial [x]  Patient with positive blood cultures  Changes discussed with ED provider Victoria PorterMark James, MD. Advised patient return to ED for possible admission and antibiotic treatment.   Contacted patient, date 05/29/2016, time 1905.  Verbalized understanding.   Victoria Terrell, Victoria Terrell [facility] 05/29/2016, 7:04 PM
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
## 2016-05-30 ENCOUNTER — Encounter (HOSPITAL_COMMUNITY): Payer: Self-pay | Admitting: Family Medicine

## 2016-05-30 DIAGNOSIS — F329 Major depressive disorder, single episode, unspecified: Secondary | ICD-10-CM | POA: Diagnosis present

## 2016-05-30 DIAGNOSIS — F1921 Other psychoactive substance dependence, in remission: Secondary | ICD-10-CM | POA: Diagnosis present

## 2016-05-30 DIAGNOSIS — M549 Dorsalgia, unspecified: Secondary | ICD-10-CM

## 2016-05-30 DIAGNOSIS — N390 Urinary tract infection, site not specified: Secondary | ICD-10-CM | POA: Insufficient documentation

## 2016-05-30 DIAGNOSIS — R7881 Bacteremia: Secondary | ICD-10-CM

## 2016-05-30 DIAGNOSIS — G8929 Other chronic pain: Secondary | ICD-10-CM

## 2016-05-30 DIAGNOSIS — N10 Acute tubulo-interstitial nephritis: Secondary | ICD-10-CM

## 2016-05-30 DIAGNOSIS — R569 Unspecified convulsions: Secondary | ICD-10-CM

## 2016-05-30 DIAGNOSIS — F32A Depression, unspecified: Secondary | ICD-10-CM | POA: Diagnosis present

## 2016-05-30 LAB — URINE CULTURE: Culture: NO GROWTH

## 2016-05-30 MED ORDER — KETOROLAC TROMETHAMINE 30 MG/ML IJ SOLN
30.0000 mg | Freq: Four times a day (QID) | INTRAMUSCULAR | Status: DC | PRN
  Administered 2016-05-30: 30 mg via INTRAVENOUS
  Filled 2016-05-30: qty 1

## 2016-05-30 MED ORDER — ONDANSETRON HCL 4 MG/2ML IJ SOLN: 4.0000 mg | Freq: Four times a day (QID) | INTRAMUSCULAR | Status: DC | PRN

## 2016-05-30 MED ORDER — DEXTROSE 5 % IV SOLN: 2.0000 g | INTRAVENOUS | Status: DC

## 2016-05-30 MED ORDER — SODIUM CHLORIDE 0.9 % IV SOLN: INTRAVENOUS | Status: DC

## 2016-05-30 MED ORDER — IOPAMIDOL (ISOVUE-300) INJECTION 61%
100.0000 mL | Freq: Once | INTRAVENOUS | Status: AC | PRN
  Administered 2016-05-30: 100 mL via INTRAVENOUS

## 2016-05-30 MED ORDER — INFLUENZA VAC SPLIT QUAD 0.5 ML IM SUSY: 0.5000 mL | PREFILLED_SYRINGE | INTRAMUSCULAR | Status: DC

## 2016-05-30 MED ORDER — SODIUM CHLORIDE 0.9 % IV BOLUS (SEPSIS)
1000.0000 mL | Freq: Once | INTRAVENOUS | Status: AC
  Administered 2016-05-30: 1000 mL via INTRAVENOUS

## 2016-05-30 MED ORDER — ACETAMINOPHEN 650 MG RE SUPP: 650.0000 mg | Freq: Four times a day (QID) | RECTAL | Status: DC | PRN

## 2016-05-30 MED ORDER — ACETAMINOPHEN 325 MG PO TABS: 650.0000 mg | ORAL_TABLET | Freq: Four times a day (QID) | ORAL | Status: DC | PRN

## 2016-05-30 MED ORDER — ONDANSETRON HCL 4 MG PO TABS: 4.0000 mg | ORAL_TABLET | Freq: Four times a day (QID) | ORAL | Status: DC | PRN

## 2016-05-30 MED ORDER — SODIUM CHLORIDE 0.9% FLUSH: 3.0000 mL | Freq: Two times a day (BID) | INTRAVENOUS | Status: DC

## 2016-05-30 MED ORDER — ALBUTEROL SULFATE (2.5 MG/3ML) 0.083% IN NEBU: 3.0000 mL | INHALATION_SOLUTION | Freq: Four times a day (QID) | RESPIRATORY_TRACT | Status: DC | PRN

## 2016-05-30 MED ORDER — SODIUM CHLORIDE 0.9 % IV SOLN
INTRAVENOUS | Status: DC
  Administered 2016-05-30: 05:00:00 via INTRAVENOUS

## 2016-05-30 MED ORDER — PNEUMOCOCCAL VAC POLYVALENT 25 MCG/0.5ML IJ INJ: 0.5000 mL | INJECTION | INTRAMUSCULAR | Status: DC

## 2016-05-30 NOTE — ED Provider Notes (Signed)
1:35 AM  Assumed care from Dr. Clayborne DanaMesner.  Patient is a 35 year old female with bacteremia, urinary tract infection. Receive ceftriaxone. Has had some slightly low blood pressure which is likely normal for her. Otherwise hemodynamically stable. CT scan shows no complications from UTI. No pyelonephritis, abscess. She does have a complex left ovarian cyst seen on CT scan. Discussed with Dr. Onalee Huaavid with hospitalist service who agrees on admission to medical, observation bed. I will place holding orders.   Victoria MawKristen N Kimani Bedoya, DO 05/30/16 [phone]
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
## 2016-05-30 NOTE — Progress Notes (Signed)
Pt left AMA after she was found trying to leave the hospital smoke. I explained to pt that risks of leaving could be death and that I could get her a nicotine patch. She said that patches didn't work and she needed to smoke.

## 2016-05-31 LAB — CULTURE, BLOOD (ROUTINE X 2)

## 2016-05-31 LAB — URINE CULTURE: Culture: NO GROWTH

## 2016-06-01 ENCOUNTER — Telehealth (HOSPITAL_BASED_OUTPATIENT_CLINIC_OR_DEPARTMENT_OTHER): Payer: Self-pay

## 2016-06-01 NOTE — Telephone Encounter (Signed)
Pt had + blood cultures and instructed to return to ED. Was admitted on 05/29/16 and left AMA on 05/30/16  [facility]  Have been - x 3 days now.  Called and left messge to return call for Symptom check.

## 2016-06-04 LAB — CULTURE, BLOOD (ROUTINE X 2)
Culture: NO GROWTH
Culture: NO GROWTH
Culture: NO GROWTH

## 2016-06-09 ENCOUNTER — Encounter (HOSPITAL_COMMUNITY): Payer: Self-pay | Admitting: Emergency Medicine

## 2016-06-09 ENCOUNTER — Emergency Department (HOSPITAL_COMMUNITY)
Admission: EM | Admit: 2016-06-09 | Discharge: 2016-06-09 | Disposition: A | Discharge status: Home / Self Care | Payer: Self-pay | Attending: Emergency Medicine | Admitting: Emergency Medicine

## 2016-06-09 DIAGNOSIS — Z79899 Other long term (current) drug therapy: Secondary | ICD-10-CM | POA: Insufficient documentation

## 2016-06-09 DIAGNOSIS — M5442 Lumbago with sciatica, left side: Secondary | ICD-10-CM | POA: Insufficient documentation

## 2016-06-09 DIAGNOSIS — M5432 Sciatica, left side: Secondary | ICD-10-CM

## 2016-06-09 DIAGNOSIS — J45909 Unspecified asthma, uncomplicated: Secondary | ICD-10-CM | POA: Insufficient documentation

## 2016-06-09 DIAGNOSIS — M5441 Lumbago with sciatica, right side: Secondary | ICD-10-CM | POA: Insufficient documentation

## 2016-06-09 DIAGNOSIS — M5431 Sciatica, right side: Secondary | ICD-10-CM

## 2016-06-09 DIAGNOSIS — F17210 Nicotine dependence, cigarettes, uncomplicated: Secondary | ICD-10-CM | POA: Insufficient documentation

## 2016-06-09 LAB — URINALYSIS, ROUTINE W REFLEX MICROSCOPIC
BILIRUBIN URINE: NEGATIVE
Glucose, UA: NEGATIVE mg/dL
HGB URINE DIPSTICK: NEGATIVE
Leukocytes, UA: NEGATIVE
Nitrite: NEGATIVE
PROTEIN: NEGATIVE mg/dL
Specific Gravity, Urine: >1.03 — ABNORMAL HIGH (ref 1.005–1.030)
pH: 6 (ref 5.0–8.0)

## 2016-06-09 MED ORDER — PREDNISONE 10 MG PO TABS: ORAL_TABLET | ORAL | 0 refills | Status: DC

## 2016-06-09 MED ORDER — OXYCODONE-ACETAMINOPHEN 5-325 MG PO TABS
2.0000 | ORAL_TABLET | Freq: Once | ORAL | Status: AC
  Administered 2016-06-09: 2 via ORAL
  Filled 2016-06-09: qty 2

## 2016-06-09 MED ORDER — OXYCODONE-ACETAMINOPHEN 5-325 MG PO TABS: 1.0000 | ORAL_TABLET | ORAL | 0 refills | Status: AC | PRN

## 2016-06-09 NOTE — ED Triage Notes (Signed)
Patient c/o lower back pain that radiates down left leg into foot. Per patient intermittent numbness in foot. Patient denies any injury. Per patient pain x2 weeks in which she was seen in ER for and diagnosed with UTI, given antibiotics, had culture done which was positive. Per patient called back and admitted into hospital for positive urine culture but didn't stay due to panic attack. Patient states she finished prescribed antibiotics from ER but has had nothing since leaving hospital. Patient denies any urinary symptoms at this time.

## 2016-06-09 NOTE — Discharge Instructions (Signed)
Alternate ice and heat to back. Call Dr. Mort SawyersHarrison's office to arrange a follow-up appointment.

## 2016-06-11 LAB — URINE CULTURE

## 2016-06-12 ENCOUNTER — Telehealth (HOSPITAL_BASED_OUTPATIENT_CLINIC_OR_DEPARTMENT_OTHER): Payer: Self-pay | Admitting: Emergency Medicine
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
## 2017-02-23 ENCOUNTER — Emergency Department (HOSPITAL_COMMUNITY)
Admission: EM | Admit: 2017-02-23 | Discharge: 2017-02-23 | Disposition: A | Discharge status: Home / Self Care | Payer: Self-pay | Attending: Emergency Medicine | Admitting: Emergency Medicine

## 2017-02-23 ENCOUNTER — Encounter (HOSPITAL_COMMUNITY): Payer: Self-pay | Admitting: Emergency Medicine

## 2017-02-23 ENCOUNTER — Emergency Department (HOSPITAL_COMMUNITY): Payer: Self-pay

## 2017-02-23 ENCOUNTER — Encounter (HOSPITAL_COMMUNITY): Payer: Self-pay | Admitting: *Deleted

## 2017-02-23 DIAGNOSIS — J45909 Unspecified asthma, uncomplicated: Secondary | ICD-10-CM | POA: Insufficient documentation

## 2017-02-23 DIAGNOSIS — R51 Headache: Secondary | ICD-10-CM | POA: Insufficient documentation

## 2017-02-23 DIAGNOSIS — R05 Cough: Secondary | ICD-10-CM | POA: Insufficient documentation

## 2017-02-23 DIAGNOSIS — N3000 Acute cystitis without hematuria: Secondary | ICD-10-CM | POA: Insufficient documentation

## 2017-02-23 DIAGNOSIS — F17210 Nicotine dependence, cigarettes, uncomplicated: Secondary | ICD-10-CM | POA: Insufficient documentation

## 2017-02-23 DIAGNOSIS — R110 Nausea: Secondary | ICD-10-CM | POA: Insufficient documentation

## 2017-02-23 DIAGNOSIS — Z79899 Other long term (current) drug therapy: Secondary | ICD-10-CM | POA: Insufficient documentation

## 2017-02-23 DIAGNOSIS — R300 Dysuria: Secondary | ICD-10-CM | POA: Insufficient documentation

## 2017-02-23 DIAGNOSIS — R109 Unspecified abdominal pain: Secondary | ICD-10-CM | POA: Insufficient documentation

## 2017-02-23 DIAGNOSIS — R509 Fever, unspecified: Secondary | ICD-10-CM | POA: Insufficient documentation

## 2017-02-23 DIAGNOSIS — M549 Dorsalgia, unspecified: Secondary | ICD-10-CM | POA: Insufficient documentation

## 2017-02-23 LAB — URINALYSIS, ROUTINE W REFLEX MICROSCOPIC
BILIRUBIN URINE: NEGATIVE
Glucose, UA: NEGATIVE mg/dL
Hgb urine dipstick: NEGATIVE
Ketones, ur: NEGATIVE mg/dL
LEUKOCYTES UA: NEGATIVE
Nitrite: POSITIVE — AB
Protein, ur: NEGATIVE mg/dL
SPECIFIC GRAVITY, URINE: 1.017 (ref 1.005–1.030)
pH: 6 (ref 5.0–8.0)

## 2017-02-23 LAB — CBC WITH DIFFERENTIAL/PLATELET
BASOS ABS: 0 10*3/uL (ref 0.0–0.1)
BASOS PCT: 1 %
EOS ABS: 0.3 10*3/uL (ref 0.0–0.7)
EOS PCT: 4 %
HCT: 40.3 % (ref 36.0–46.0)
Hemoglobin: 13.6 g/dL (ref 12.0–15.0)
LYMPHS PCT: 51 %
Lymphs Abs: 3.3 10*3/uL (ref 0.7–4.0)
MCH: 29.3 pg (ref 26.0–34.0)
MCHC: 33.7 g/dL (ref 30.0–36.0)
MCV: 86.9 fL (ref 78.0–100.0)
Monocytes Absolute: 0.4 10*3/uL (ref 0.1–1.0)
Monocytes Relative: 6 %
Neutro Abs: 2.5 10*3/uL (ref 1.7–7.7)
Neutrophils Relative %: 38 %
PLATELETS: 239 10*3/uL (ref 150–400)
RBC: 4.64 MIL/uL (ref 3.87–5.11)
RDW: 14.1 % (ref 11.5–15.5)
WBC: 6.5 10*3/uL (ref 4.0–10.5)

## 2017-02-23 LAB — COMPREHENSIVE METABOLIC PANEL
ALBUMIN: 3.7 g/dL (ref 3.5–5.0)
ALT: 36 U/L (ref 14–54)
AST: 31 U/L (ref 15–41)
Alkaline Phosphatase: 78 U/L (ref 38–126)
Anion gap: 7 (ref 5–15)
BUN: 11 mg/dL (ref 6–20)
CHLORIDE: 104 mmol/L (ref 101–111)
CO2: 28 mmol/L (ref 22–32)
CREATININE: 0.67 mg/dL (ref 0.44–1.00)
Calcium: 9 mg/dL (ref 8.9–10.3)
GFR calc Af Amer: >60 mL/min (ref >60)
GFR calc non Af Amer: >60 mL/min (ref >60)
GLUCOSE: 85 mg/dL (ref 65–99)
Potassium: 3.6 mmol/L (ref 3.5–5.1)
SODIUM: 139 mmol/L (ref 135–145)
Total Bilirubin: 0.4 mg/dL (ref 0.3–1.2)
Total Protein: 7.2 g/dL (ref 6.5–8.1)

## 2017-02-23 LAB — LIPASE, BLOOD: Lipase: 15 U/L (ref 11–51)

## 2017-02-23 LAB — PREGNANCY, URINE: PREG TEST UR: NEGATIVE

## 2017-02-23 MED ORDER — ACETAMINOPHEN 325 MG PO TABS
650.0000 mg | ORAL_TABLET | Freq: Once | ORAL | Status: AC
  Administered 2017-02-23: 650 mg via ORAL
  Filled 2017-02-23: qty 2

## 2017-02-23 MED ORDER — PHENAZOPYRIDINE HCL 100 MG PO TABS
200.0000 mg | ORAL_TABLET | Freq: Once | ORAL | Status: AC
  Administered 2017-02-23: 200 mg via ORAL
  Filled 2017-02-23: qty 2

## 2017-02-23 MED ORDER — PHENAZOPYRIDINE HCL 200 MG PO TABS: 200.0000 mg | ORAL_TABLET | Freq: Three times a day (TID) | ORAL | 0 refills | Status: AC | PRN

## 2017-02-23 MED ORDER — CEPHALEXIN 500 MG PO CAPS: 500.0000 mg | ORAL_CAPSULE | Freq: Three times a day (TID) | ORAL | 0 refills | Status: DC

## 2017-02-23 MED ORDER — CEPHALEXIN 500 MG PO CAPS
500.0000 mg | ORAL_CAPSULE | Freq: Once | ORAL | Status: AC
  Administered 2017-02-23: 500 mg via ORAL
  Filled 2017-02-23: qty 1

## 2017-02-23 MED ORDER — ONDANSETRON HCL 4 MG PO TABS: 4.0000 mg | ORAL_TABLET | Freq: Three times a day (TID) | ORAL | 0 refills | Status: AC | PRN

## 2017-02-23 NOTE — ED Notes (Signed)
Pt alert & oriented x4, stable gait. Patient given discharge instructions, paperwork & prescription(s). Pt friend/signicant other upset wanting to know what time another MD was going to be in the ER. Stated that he knows she's got an infection in her blood & wants some dam testing done. Stated was going to sit in the waiting room until current MD leaves. Patient verbalized understanding. Pt left department w/ no further questions.

## 2017-02-23 NOTE — ED Triage Notes (Signed)
Pt states hx of recurrent UTI. Pain for the past 3 days getting worse, has noticed urine darker & foul odor.
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
## 2017-02-23 NOTE — ED Triage Notes (Addendum)
Pt seen in ED earlier this morning, and dx with cystitis, given rx for antbx and pyridium. Pt reports she doesn't think its a bladder infection she feels like its some other type of infection. After initial d/c this morning pt slept in [facility] until now. When pt checked back in she proceeded to walk outside listen to music and smoke a cigarette before answering for triage. Pt appears in NAD distress.
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
## 2017-02-23 NOTE — Discharge Instructions (Signed)
Drink plenty of fluids. Take the pyridium with acetaminophen 1000 mg 4 times a day for pain. Use the zofran for nausea as needed. Recheck if you get a fever, have uncontrolled vomiting or worsening pain.

## 2017-02-23 NOTE — ED Notes (Signed)
Pt made aware to return if symptoms worsen or if any life threatening symptoms occur.   

## 2017-02-23 NOTE — Discharge Instructions (Signed)
Your testing was normal - labs and CT scan See your doctor as needed

## 2018-04-01 ENCOUNTER — Other Ambulatory Visit: Payer: Self-pay

## 2018-04-01 ENCOUNTER — Emergency Department (HOSPITAL_COMMUNITY)
Admission: EM | Admit: 2018-04-01 | Discharge: 2018-04-01 | Disposition: A | Discharge status: Home / Self Care | Payer: Self-pay | Attending: Emergency Medicine | Admitting: Emergency Medicine

## 2018-04-01 ENCOUNTER — Encounter (HOSPITAL_COMMUNITY): Payer: Self-pay | Admitting: Emergency Medicine

## 2018-04-01 DIAGNOSIS — Z5321 Procedure and treatment not carried out due to patient leaving prior to being seen by health care provider: Secondary | ICD-10-CM | POA: Insufficient documentation

## 2018-04-01 DIAGNOSIS — M545 Low back pain: Secondary | ICD-10-CM | POA: Insufficient documentation

## 2018-04-01 NOTE — ED Triage Notes (Signed)
Patient complaining of dizziness and diaphoresis x 2 weeks. States she slid down approximately 10 steps on her bottom 10 days ago. Complaining of lower back pain since fall.

## 2018-04-01 NOTE — ED Triage Notes (Signed)
Registration informed triage RN that pt left prior to pt being brought back to room.

## 2018-04-08 ENCOUNTER — Encounter (HOSPITAL_COMMUNITY): Payer: Self-pay | Admitting: Emergency Medicine

## 2018-04-08 ENCOUNTER — Emergency Department (HOSPITAL_COMMUNITY)
Admission: EM | Admit: 2018-04-08 | Discharge: 2018-04-08 | Disposition: A | Discharge status: Home / Self Care | Payer: Self-pay | Attending: Emergency Medicine | Admitting: Emergency Medicine

## 2018-04-08 ENCOUNTER — Other Ambulatory Visit: Payer: Self-pay

## 2018-04-08 DIAGNOSIS — M79605 Pain in left leg: Secondary | ICD-10-CM | POA: Insufficient documentation

## 2018-04-08 DIAGNOSIS — M545 Low back pain, unspecified: Secondary | ICD-10-CM

## 2018-04-08 DIAGNOSIS — N390 Urinary tract infection, site not specified: Secondary | ICD-10-CM | POA: Insufficient documentation

## 2018-04-08 DIAGNOSIS — F17210 Nicotine dependence, cigarettes, uncomplicated: Secondary | ICD-10-CM | POA: Insufficient documentation

## 2018-04-08 DIAGNOSIS — J45909 Unspecified asthma, uncomplicated: Secondary | ICD-10-CM | POA: Insufficient documentation

## 2018-04-08 LAB — CBC WITH DIFFERENTIAL/PLATELET
BASOS PCT: 0 %
Basophils Absolute: 0 10*3/uL (ref 0.0–0.1)
Eosinophils Absolute: 0.1 10*3/uL (ref 0.0–0.7)
Eosinophils Relative: 2 %
HEMATOCRIT: 42.2 % (ref 36.0–46.0)
HEMOGLOBIN: 14 g/dL (ref 12.0–15.0)
Lymphocytes Relative: 24 %
Lymphs Abs: 1.9 10*3/uL (ref 0.7–4.0)
MCH: 28.5 pg (ref 26.0–34.0)
MCHC: 33.2 g/dL (ref 30.0–36.0)
MCV: 85.8 fL (ref 78.0–100.0)
MONO ABS: 0.4 10*3/uL (ref 0.1–1.0)
Monocytes Relative: 5 %
NEUTROS ABS: 5.5 10*3/uL (ref 1.7–7.7)
NEUTROS PCT: 69 %
Platelets: 237 10*3/uL (ref 150–400)
RBC: 4.92 MIL/uL (ref 3.87–5.11)
RDW: 13.2 % (ref 11.5–15.5)
WBC: 7.8 10*3/uL (ref 4.0–10.5)

## 2018-04-08 LAB — URINALYSIS, ROUTINE W REFLEX MICROSCOPIC
BILIRUBIN URINE: NEGATIVE
Glucose, UA: NEGATIVE mg/dL
KETONES UR: NEGATIVE mg/dL
Leukocytes, UA: NEGATIVE
Nitrite: POSITIVE — AB
PH: 5 (ref 5.0–8.0)
PROTEIN: NEGATIVE mg/dL
Specific Gravity, Urine: 1.021 (ref 1.005–1.030)

## 2018-04-08 LAB — BASIC METABOLIC PANEL
ANION GAP: 6 (ref 5–15)
BUN: 10 mg/dL (ref 6–20)
CALCIUM: 9.2 mg/dL (ref 8.9–10.3)
CO2: 29 mmol/L (ref 22–32)
Chloride: 104 mmol/L (ref 98–111)
Creatinine, Ser: 0.81 mg/dL (ref 0.44–1.00)
Glucose, Bld: 120 mg/dL — ABNORMAL HIGH (ref 70–99)
Potassium: 4.2 mmol/L (ref 3.5–5.1)
Sodium: 139 mmol/L (ref 135–145)

## 2018-04-08 MED ORDER — CEPHALEXIN 500 MG PO CAPS: 500.0000 mg | ORAL_CAPSULE | Freq: Four times a day (QID) | ORAL | 0 refills | Status: DC

## 2018-04-08 MED ORDER — CEPHALEXIN 500 MG PO CAPS
500.0000 mg | ORAL_CAPSULE | Freq: Four times a day (QID) | ORAL | 0 refills | Status: AC
Start: 2018-04-08 — End: ?

## 2018-04-08 MED ORDER — CEPHALEXIN 500 MG PO CAPS
500.0000 mg | ORAL_CAPSULE | Freq: Once | ORAL | Status: AC
  Administered 2018-04-08: 500 mg via ORAL
  Filled 2018-04-08: qty 1

## 2018-04-08 MED ORDER — ACETAMINOPHEN 500 MG PO TABS
1000.0000 mg | ORAL_TABLET | Freq: Once | ORAL | Status: AC
  Administered 2018-04-08: 1000 mg via ORAL
  Filled 2018-04-08: qty 2

## 2018-04-08 MED ORDER — ONDANSETRON HCL 4 MG PO TABS
4.0000 mg | ORAL_TABLET | Freq: Once | ORAL | Status: AC
  Administered 2018-04-08: 4 mg via ORAL
  Filled 2018-04-08: qty 1

## 2018-04-08 MED ORDER — LIDOCAINE 1.8 % EX PTCH: 1.0000 | MEDICATED_PATCH | Freq: Two times a day (BID) | CUTANEOUS | 1 refills | Status: DC

## 2018-04-08 MED ORDER — LIDOCAINE 1.8 % EX PTCH: 1.0000 | MEDICATED_PATCH | Freq: Two times a day (BID) | CUTANEOUS | 1 refills | Status: AC

## 2018-04-08 NOTE — Discharge Instructions (Signed)
Your vital signs are within normal limits.  Your oxygen level is 100% on room air.  Your urine questions a possible infection.  A culture has been sent to the lab.  Please start Keflex 4 times daily with breakfast, lunch, dinner, and at bedtime.  Please use Tylenol extra strength every 4 hours.  Please use a lidocaine patch to your lower back morning and evening.  [facility], or the [facility] to help establish a primary physician to help you with your back and to follow-up with your urine.
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
## 2018-04-08 NOTE — ED Triage Notes (Addendum)
Painful urination with odor, low back pain  and feels like she has had a fever x 2-3 weeks

## 2018-04-10 LAB — URINE CULTURE

## 2018-04-11 ENCOUNTER — Telehealth: Payer: Self-pay

## 2018-04-11 NOTE — Telephone Encounter (Incomplete)
Post ED Visit - Positive Culture Follow-up  Culture report reviewed by antimicrobial stewardship pharmacist:  []  Victoria Terrell, Pharm.D. []  Victoria Terrell, Pharm.D., BCPS AQ-ID []  Victoria Terrell, Pharm.D., BCPS []  Victoria Terrell, Pharm.D., BCPS []  [name], [place].D., BCPS, AAHIVP []  Victoria Terrell, Pharm.D., BCPS, AAHIVP []  Victoria Terrell, PharmD, BCPS []  Victoria Terrell, PharmD, BCPS []  Victoria Terrell, PharmD, BCPS []  Victoria Terrell, PharmD Victoria Terrell Pharm D Positive urine culture Treated with Cephalexin, organism sensitive to the same and no further patient follow-up is required at this time.  Victoria Terrell, Victoria Terrell 04/11/2018, 10:02 AM

## 2018-06-23 ENCOUNTER — Emergency Department (HOSPITAL_COMMUNITY): Payer: Self-pay

## 2018-06-23 ENCOUNTER — Encounter (HOSPITAL_COMMUNITY): Payer: Self-pay | Admitting: Emergency Medicine

## 2018-06-23 ENCOUNTER — Emergency Department (HOSPITAL_COMMUNITY)
Admission: EM | Admit: 2018-06-23 | Discharge: 2018-06-23 | Disposition: A | Discharge status: Home / Self Care | Payer: Self-pay | Attending: Emergency Medicine | Admitting: Emergency Medicine

## 2018-06-23 DIAGNOSIS — M5441 Lumbago with sciatica, right side: Secondary | ICD-10-CM | POA: Insufficient documentation

## 2018-06-23 DIAGNOSIS — Z79899 Other long term (current) drug therapy: Secondary | ICD-10-CM | POA: Insufficient documentation

## 2018-06-23 DIAGNOSIS — F17210 Nicotine dependence, cigarettes, uncomplicated: Secondary | ICD-10-CM | POA: Insufficient documentation

## 2018-06-23 DIAGNOSIS — J45909 Unspecified asthma, uncomplicated: Secondary | ICD-10-CM | POA: Insufficient documentation

## 2018-06-23 DIAGNOSIS — M5442 Lumbago with sciatica, left side: Secondary | ICD-10-CM | POA: Insufficient documentation

## 2018-06-23 MED ORDER — PREDNISONE 10 MG PO TABS: 20.0000 mg | ORAL_TABLET | Freq: Every day | ORAL | 0 refills | Status: DC

## 2018-06-23 MED ORDER — DICLOFENAC SODIUM 50 MG PO TBEC: 50.0000 mg | DELAYED_RELEASE_TABLET | Freq: Two times a day (BID) | ORAL | 0 refills | Status: AC

## 2018-06-23 MED ORDER — OXYCODONE-ACETAMINOPHEN 5-325 MG PO TABS: 1.0000 | ORAL_TABLET | ORAL | 0 refills | Status: AC | PRN

## 2018-06-23 MED ORDER — HYDROMORPHONE HCL 1 MG/ML IJ SOLN
1.0000 mg | Freq: Once | INTRAMUSCULAR | Status: AC
  Administered 2018-06-23: 1 mg via INTRAMUSCULAR
  Filled 2018-06-23: qty 1

## 2018-06-23 NOTE — ED Notes (Signed)
Pt has had a hysterectomy  

## 2018-06-23 NOTE — ED Triage Notes (Signed)
Pt states back pain for 2 weeks with no known injury.  Reports it feels like a cramp and she has to lay on her stomach and stretch out to relieve pain.
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
## 2018-06-23 NOTE — ED Notes (Signed)
Pt left and is not driving

## 2018-06-23 NOTE — Discharge Instructions (Signed)
You have degenerative disc disease in your lower spine.  Prescription for pain medicine, anti-inflammatory, prednisone.  Follow-up with primary care clinic.  If symptoms persist, you may need an MRI.  Return for bowel or bladder incontinence.

## 2018-06-23 NOTE — ED Notes (Signed)
Pt back from x-ray.
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
## 2018-07-04 ENCOUNTER — Telehealth: Payer: Self-pay

## 2018-07-04 NOTE — Telephone Encounter (Signed)
Attempted call to client to reschedule her visit that is at 2 pm today. Left message for return call.  Francee Nodal RN

## 2018-07-04 NOTE — Telephone Encounter (Signed)
[employer] person that Layce lives with and phone she uses, returned call. Information given that Linnell's appointment for 2 pm today needs to be canceled and rescheduled. Appointment rescheduled for 07/08/18 at 2 pm at [facility] to meet with Social work Tax inspector.  Steward Drone states she will given information to [facility].   [employer] listed as emergency contact as client does not have a phone.  Norval Gable RN

## 2018-07-15 ENCOUNTER — Ambulatory Visit: Payer: Self-pay | Admitting: Physician Assistant

## 2018-07-31 ENCOUNTER — Encounter: Payer: Self-pay | Admitting: Physician Assistant

## 2018-09-30 ENCOUNTER — Emergency Department (HOSPITAL_COMMUNITY): Payer: Self-pay

## 2018-09-30 ENCOUNTER — Encounter (HOSPITAL_COMMUNITY): Payer: Self-pay | Admitting: Emergency Medicine

## 2018-09-30 ENCOUNTER — Emergency Department (HOSPITAL_COMMUNITY)
Admission: EM | Admit: 2018-09-30 | Discharge: 2018-09-30 | Disposition: A | Discharge status: Home / Self Care | Payer: Self-pay | Attending: Emergency Medicine | Admitting: Emergency Medicine

## 2018-09-30 ENCOUNTER — Other Ambulatory Visit: Payer: Self-pay

## 2018-09-30 DIAGNOSIS — Z79899 Other long term (current) drug therapy: Secondary | ICD-10-CM | POA: Insufficient documentation

## 2018-09-30 DIAGNOSIS — F17210 Nicotine dependence, cigarettes, uncomplicated: Secondary | ICD-10-CM | POA: Insufficient documentation

## 2018-09-30 DIAGNOSIS — F329 Major depressive disorder, single episode, unspecified: Secondary | ICD-10-CM | POA: Insufficient documentation

## 2018-09-30 DIAGNOSIS — J45909 Unspecified asthma, uncomplicated: Secondary | ICD-10-CM | POA: Insufficient documentation

## 2018-09-30 DIAGNOSIS — M25511 Pain in right shoulder: Secondary | ICD-10-CM | POA: Insufficient documentation

## 2018-09-30 DIAGNOSIS — F419 Anxiety disorder, unspecified: Secondary | ICD-10-CM | POA: Insufficient documentation

## 2018-09-30 DIAGNOSIS — Z9049 Acquired absence of other specified parts of digestive tract: Secondary | ICD-10-CM | POA: Insufficient documentation

## 2018-09-30 MED ORDER — HYDROCODONE-ACETAMINOPHEN 5-325 MG PO TABS: ORAL_TABLET | ORAL | 0 refills | Status: DC

## 2018-09-30 MED ORDER — METHOCARBAMOL 500 MG PO TABS: 500.0000 mg | ORAL_TABLET | Freq: Three times a day (TID) | ORAL | 0 refills | Status: AC

## 2018-09-30 NOTE — ED Triage Notes (Signed)
Pt states the her right shoulder has been hurting for the past two weeks. States no injury that she is aware of, no deformities noted.
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
## 2018-09-30 NOTE — Discharge Instructions (Addendum)
Apply ice packs on and off to your shoulder.  Take the medication as directed.  Try the range of motion exercises as discussed.  Call 1 of the orthopedic providers listed in 1 week to arrange a follow-up appointment if not improving.

## 2018-10-29 ENCOUNTER — Other Ambulatory Visit: Payer: Self-pay

## 2018-10-29 ENCOUNTER — Emergency Department (HOSPITAL_COMMUNITY)
Admission: EM | Admit: 2018-10-29 | Discharge: 2018-10-29 | Disposition: A | Discharge status: Home / Self Care | Payer: Self-pay | Attending: Emergency Medicine | Admitting: Emergency Medicine

## 2018-10-29 ENCOUNTER — Emergency Department (HOSPITAL_COMMUNITY): Payer: Self-pay

## 2018-10-29 ENCOUNTER — Encounter (HOSPITAL_COMMUNITY): Payer: Self-pay

## 2018-10-29 DIAGNOSIS — K029 Dental caries, unspecified: Secondary | ICD-10-CM | POA: Insufficient documentation

## 2018-10-29 DIAGNOSIS — K0889 Other specified disorders of teeth and supporting structures: Secondary | ICD-10-CM

## 2018-10-29 DIAGNOSIS — G8929 Other chronic pain: Secondary | ICD-10-CM | POA: Insufficient documentation

## 2018-10-29 DIAGNOSIS — Z79899 Other long term (current) drug therapy: Secondary | ICD-10-CM | POA: Insufficient documentation

## 2018-10-29 DIAGNOSIS — F17210 Nicotine dependence, cigarettes, uncomplicated: Secondary | ICD-10-CM | POA: Insufficient documentation

## 2018-10-29 DIAGNOSIS — M25511 Pain in right shoulder: Secondary | ICD-10-CM | POA: Insufficient documentation

## 2018-10-29 DIAGNOSIS — J45909 Unspecified asthma, uncomplicated: Secondary | ICD-10-CM | POA: Insufficient documentation

## 2018-10-29 MED ORDER — HYDROCODONE-ACETAMINOPHEN 5-325 MG PO TABS: ORAL_TABLET | ORAL | 0 refills | Status: AC

## 2018-10-29 MED ORDER — HYDROCODONE-ACETAMINOPHEN 5-325 MG PO TABS
1.0000 | ORAL_TABLET | Freq: Once | ORAL | Status: AC
Start: 2018-10-29 — End: 2018-10-29
  Administered 2018-10-29: 1 via ORAL
  Filled 2018-10-29: qty 1

## 2018-10-29 MED ORDER — PENICILLIN V POTASSIUM 500 MG PO TABS: 500.0000 mg | ORAL_TABLET | Freq: Three times a day (TID) | ORAL | 0 refills | Status: AC

## 2018-10-29 MED ORDER — PREDNISONE 10 MG PO TABS: ORAL_TABLET | ORAL | 0 refills | Status: AC

## 2018-10-29 NOTE — Discharge Instructions (Addendum)
Apply ice packs on and off to your shoulder and collar bone.  Take the medication as directed.  Follow-up with your primary doctor for recheck or contact the orthopedic provider listed.

## 2018-10-29 NOTE — ED Triage Notes (Signed)
Pt c/o pain in r side of neck and r shoulder since Christmas.  Swelling noted over r clavicle.  Denies injury.  Reports was evaluated here for same in the past and was told she had tendonitis.
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
## 2019-07-12 IMAGING — DX DG CLAVICLE*R*
2 series · 2 of 2 positions shown · non-contrast
Comparison: Radiographs September 30, 2018.

CLINICAL DATA: Right clavicular pain.  No known injury.

EXAM:
RIGHT CLAVICLE - 2+ VIEWS

[clavicle ap]
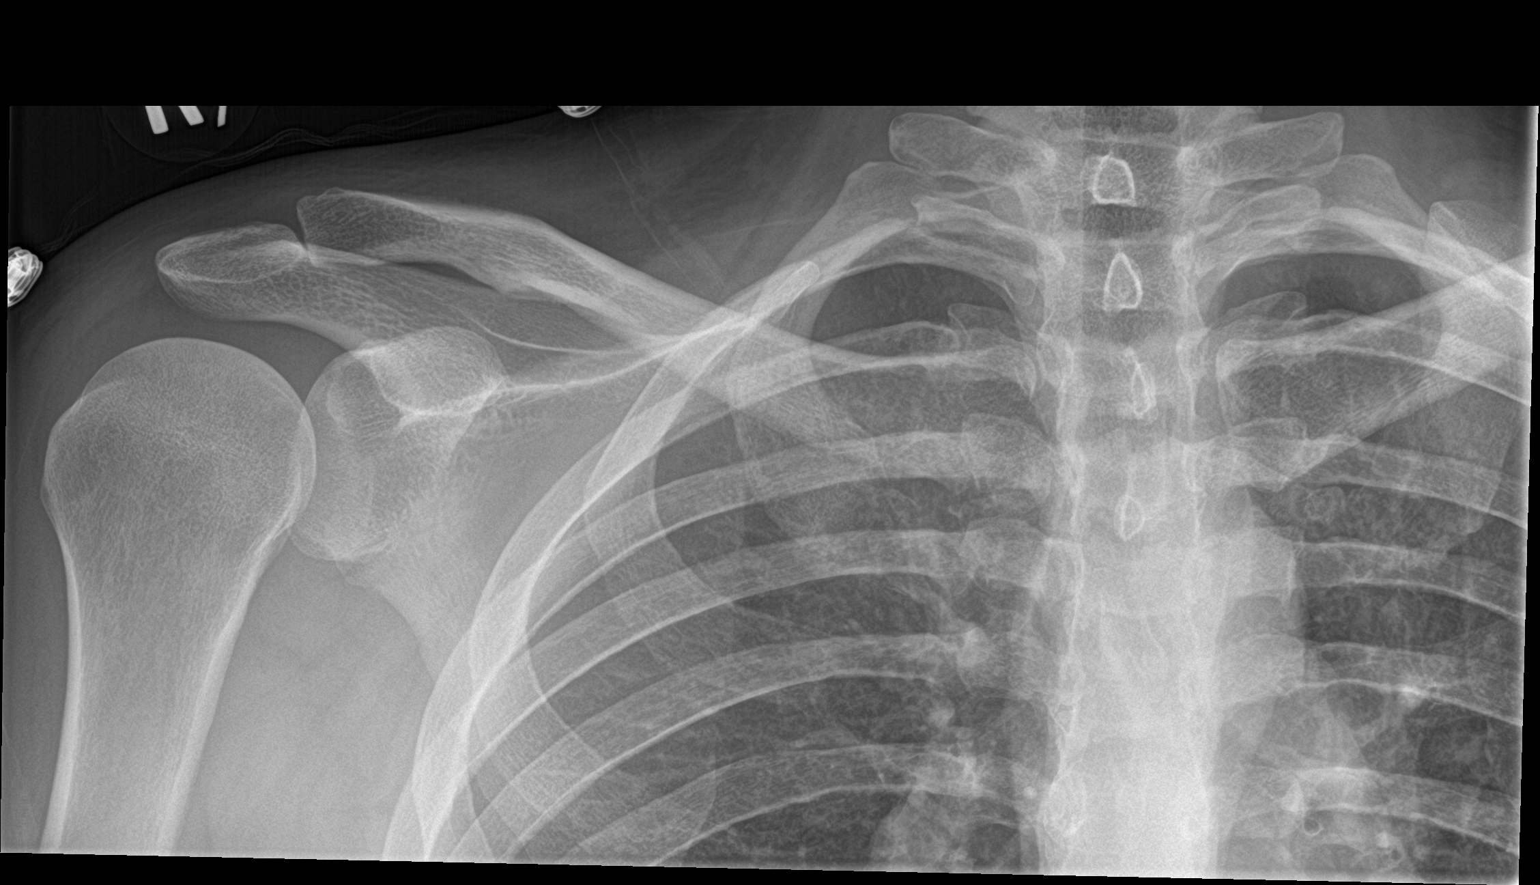

[clavicle axial]
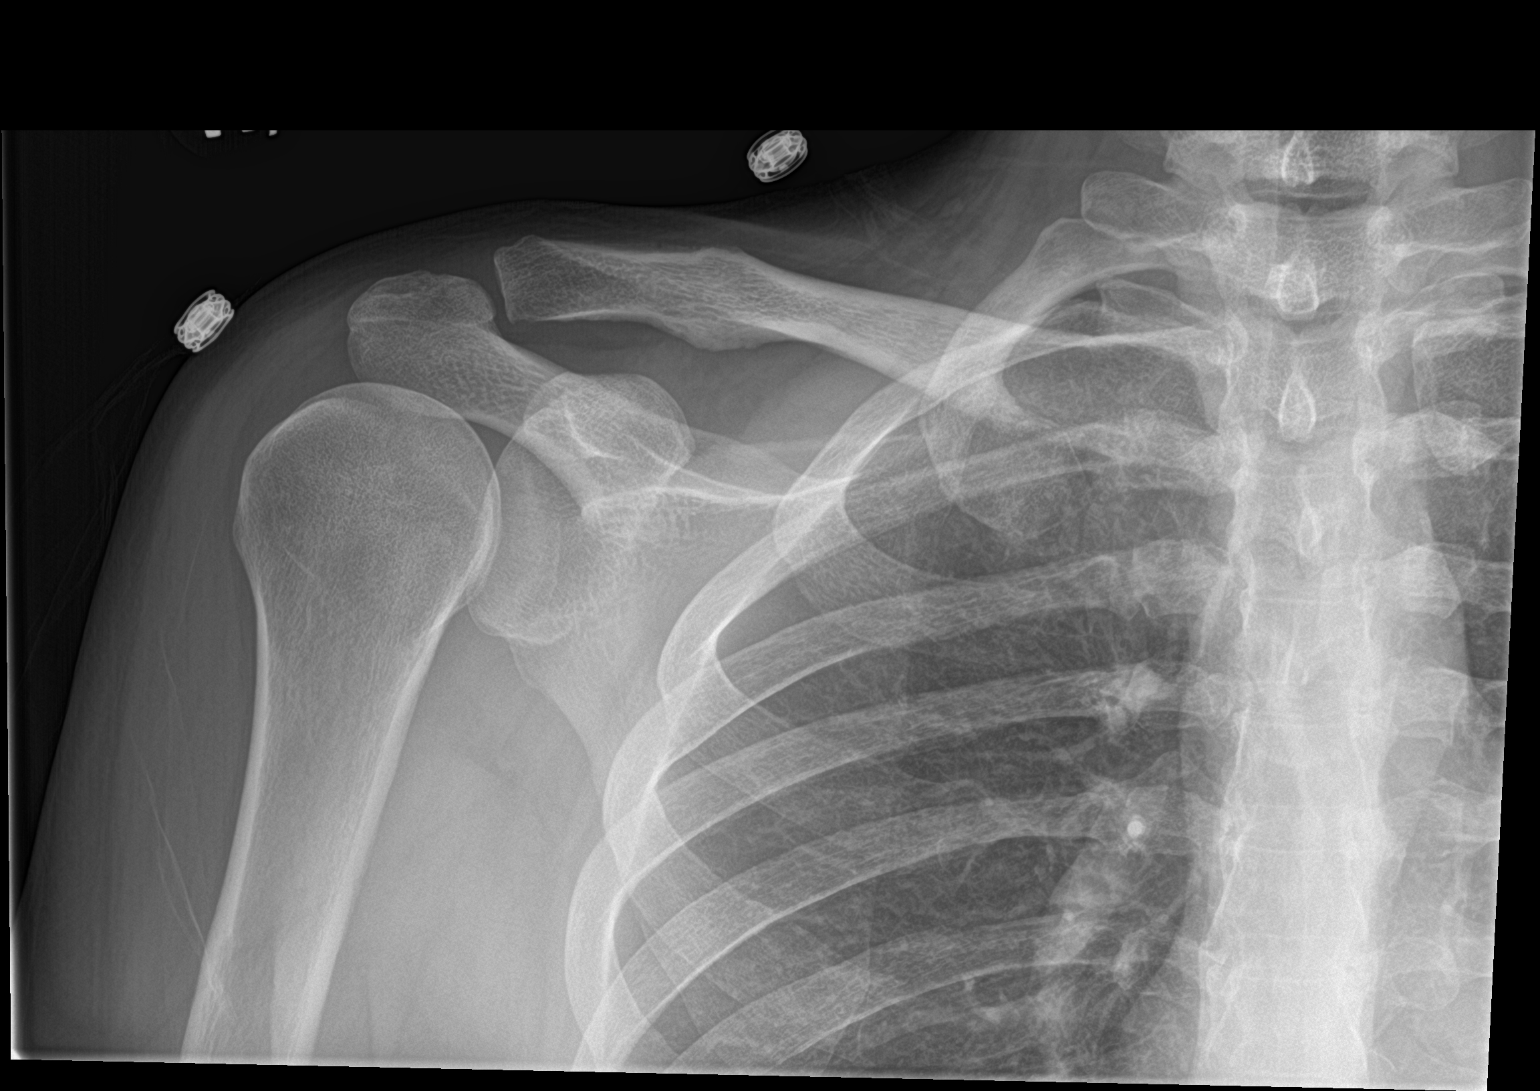

[2 of 2 positions shown; findings below may reference images not displayed]

FINDINGS: There is no evidence of fracture or other focal bone lesions. Soft
tissues are unremarkable.
IMPRESSION: Negative.

## 2020-10-07 ENCOUNTER — Encounter (HOSPITAL_COMMUNITY): Payer: Self-pay

## 2022-10-05 ENCOUNTER — Telehealth: Payer: Self-pay

## 2022-10-05 NOTE — Telephone Encounter (Signed)
Mychart msg sent. AS, CMA 

## 2023-07-17 DIAGNOSIS — M25519 Pain in unspecified shoulder: Secondary | ICD-10-CM | POA: Diagnosis not present

## 2023-07-17 DIAGNOSIS — M542 Cervicalgia: Secondary | ICD-10-CM | POA: Diagnosis not present

## 2024-06-22 ENCOUNTER — Other Ambulatory Visit: Payer: Self-pay

## 2024-06-22 ENCOUNTER — Emergency Department (HOSPITAL_COMMUNITY)
Admission: EM | Admit: 2024-06-22 | Discharge: 2024-06-22 | Disposition: A | Discharge status: Home / Self Care | Attending: Emergency Medicine | Admitting: Emergency Medicine

## 2024-06-22 ENCOUNTER — Encounter (HOSPITAL_COMMUNITY): Payer: Self-pay

## 2024-06-22 DIAGNOSIS — T2102XA Burn of unspecified degree of abdominal wall, initial encounter: Secondary | ICD-10-CM | POA: Diagnosis present

## 2024-06-22 DIAGNOSIS — T2122XA Burn of second degree of abdominal wall, initial encounter: Secondary | ICD-10-CM | POA: Diagnosis not present

## 2024-06-22 DIAGNOSIS — X101XXA Contact with hot food, initial encounter: Secondary | ICD-10-CM | POA: Diagnosis not present

## 2024-06-22 DIAGNOSIS — Z23 Encounter for immunization: Secondary | ICD-10-CM | POA: Diagnosis not present

## 2024-06-22 DIAGNOSIS — T2121XA Burn of second degree of chest wall, initial encounter: Secondary | ICD-10-CM | POA: Insufficient documentation

## 2024-06-22 DIAGNOSIS — T310 Burns involving less than 10% of body surface: Secondary | ICD-10-CM | POA: Diagnosis not present

## 2024-06-22 MED ORDER — FENTANYL CITRATE (PF) 100 MCG/2ML IJ SOLN
75.0000 ug | Freq: Once | INTRAMUSCULAR | Status: AC
  Administered 2024-06-22: 75 ug via INTRAVENOUS
  Filled 2024-06-22: qty 2

## 2024-06-22 MED ORDER — BACITRACIN ZINC 500 UNIT/GM EX OINT: 1.0000 | TOPICAL_OINTMENT | Freq: Every day | CUTANEOUS | 0 refills | Status: AC

## 2024-06-22 MED ORDER — SILVER SULFADIAZINE 1 % EX CREA: 1.0000 | TOPICAL_CREAM | Freq: Every day | CUTANEOUS | 0 refills | Status: AC

## 2024-06-22 MED ORDER — OXYCODONE HCL 5 MG PO TABS
5.0000 mg | ORAL_TABLET | Freq: Once | ORAL | Status: AC
  Administered 2024-06-22: 5 mg via ORAL
  Filled 2024-06-22: qty 1

## 2024-06-22 MED ORDER — TETANUS-DIPHTH-ACELL PERTUSSIS 5-2-15.5 LF-MCG/0.5 IM SUSP
0.5000 mL | Freq: Once | INTRAMUSCULAR | Status: AC
  Administered 2024-06-22: 0.5 mL via INTRAMUSCULAR
  Filled 2024-06-22: qty 0.5

## 2024-06-22 MED ORDER — OXYCODONE HCL 5 MG PO TABS: 5.0000 mg | ORAL_TABLET | Freq: Three times a day (TID) | ORAL | 0 refills | Status: AC | PRN
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
## 2024-06-22 NOTE — ED Notes (Signed)
 ED Provider at bedside.
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
